# Patient Record
Sex: Female | Born: 1935 | Race: White | Hispanic: No | Smoking: Never smoker
Health system: Southern US, Community
[De-identification: ages and names within clinical notes are randomized; demographics above are authoritative.]

## PROBLEM LIST (undated history)

## (undated) DIAGNOSIS — M1710 Unilateral primary osteoarthritis, unspecified knee: Secondary | ICD-10-CM

## (undated) DIAGNOSIS — I341 Nonrheumatic mitral (valve) prolapse: Secondary | ICD-10-CM

## (undated) DIAGNOSIS — M1990 Unspecified osteoarthritis, unspecified site: Secondary | ICD-10-CM

## (undated) DIAGNOSIS — M169 Osteoarthritis of hip, unspecified: Secondary | ICD-10-CM

## (undated) DIAGNOSIS — I639 Cerebral infarction, unspecified: Secondary | ICD-10-CM

## (undated) DIAGNOSIS — D509 Iron deficiency anemia, unspecified: Secondary | ICD-10-CM

## (undated) DIAGNOSIS — R011 Cardiac murmur, unspecified: Secondary | ICD-10-CM

## (undated) HISTORY — PX: APPENDECTOMY: SHX54

## (undated) HISTORY — PX: KNEE ARTHROSCOPY: SUR90

## (undated) HISTORY — PX: BACK SURGERY: SHX140

---

## 1998-03-10 ENCOUNTER — Inpatient Hospital Stay (HOSPITAL_COMMUNITY): Admission: RE | Admit: 1998-03-10 | Discharge: 1998-03-16 | Payer: Self-pay | Admitting: Neurosurgery

## 1998-03-24 ENCOUNTER — Emergency Department (HOSPITAL_COMMUNITY): Admission: EM | Admit: 1998-03-24 | Discharge: 1998-03-24 | Payer: Self-pay | Admitting: Emergency Medicine

## 1998-08-05 ENCOUNTER — Other Ambulatory Visit: Admission: RE | Admit: 1998-08-05 | Discharge: 1998-08-05 | Payer: Self-pay | Admitting: Obstetrics and Gynecology

## 1999-02-01 ENCOUNTER — Encounter: Admission: RE | Admit: 1999-02-01 | Discharge: 1999-05-02 | Payer: Self-pay | Admitting: *Deleted

## 1999-06-15 ENCOUNTER — Ambulatory Visit (HOSPITAL_COMMUNITY): Admission: RE | Admit: 1999-06-15 | Discharge: 1999-06-15 | Payer: Self-pay | Admitting: Ophthalmology

## 1999-09-27 ENCOUNTER — Other Ambulatory Visit: Admission: RE | Admit: 1999-09-27 | Discharge: 1999-09-27 | Payer: Self-pay | Admitting: Obstetrics and Gynecology

## 1999-11-09 ENCOUNTER — Ambulatory Visit (HOSPITAL_COMMUNITY): Admission: RE | Admit: 1999-11-09 | Discharge: 1999-11-09 | Payer: Self-pay | Admitting: Gastroenterology

## 2000-01-05 ENCOUNTER — Ambulatory Visit (HOSPITAL_COMMUNITY): Admission: RE | Admit: 2000-01-05 | Discharge: 2000-01-05 | Payer: Self-pay | Admitting: *Deleted

## 2000-01-05 ENCOUNTER — Encounter: Payer: Self-pay | Admitting: Gastroenterology

## 2000-02-02 ENCOUNTER — Ambulatory Visit (HOSPITAL_COMMUNITY): Admission: RE | Admit: 2000-02-02 | Discharge: 2000-02-02 | Payer: Self-pay | Admitting: Gastroenterology

## 2000-02-02 ENCOUNTER — Encounter (INDEPENDENT_AMBULATORY_CARE_PROVIDER_SITE_OTHER): Payer: Self-pay | Admitting: Specialist

## 2000-09-27 ENCOUNTER — Other Ambulatory Visit: Admission: RE | Admit: 2000-09-27 | Discharge: 2000-09-27 | Payer: Self-pay | Admitting: Obstetrics and Gynecology

## 2001-03-30 ENCOUNTER — Emergency Department (HOSPITAL_COMMUNITY): Admission: EM | Admit: 2001-03-30 | Discharge: 2001-03-30 | Payer: Self-pay | Admitting: Emergency Medicine

## 2001-03-30 ENCOUNTER — Encounter: Payer: Self-pay | Admitting: Orthopedic Surgery

## 2001-10-04 ENCOUNTER — Other Ambulatory Visit: Admission: RE | Admit: 2001-10-04 | Discharge: 2001-10-04 | Payer: Self-pay | Admitting: Obstetrics and Gynecology

## 2002-05-28 ENCOUNTER — Encounter: Admission: RE | Admit: 2002-05-28 | Discharge: 2002-05-28 | Payer: Self-pay | Admitting: Gastroenterology

## 2002-05-28 ENCOUNTER — Encounter: Payer: Self-pay | Admitting: Gastroenterology

## 2002-06-10 ENCOUNTER — Ambulatory Visit (HOSPITAL_COMMUNITY): Admission: RE | Admit: 2002-06-10 | Discharge: 2002-06-10 | Payer: Self-pay | Admitting: Gastroenterology

## 2002-06-10 ENCOUNTER — Encounter (INDEPENDENT_AMBULATORY_CARE_PROVIDER_SITE_OTHER): Payer: Self-pay

## 2004-12-14 ENCOUNTER — Observation Stay (HOSPITAL_COMMUNITY): Admission: RE | Admit: 2004-12-14 | Discharge: 2004-12-15 | Payer: Self-pay | Admitting: *Deleted

## 2006-02-02 ENCOUNTER — Emergency Department (HOSPITAL_COMMUNITY): Admission: EM | Admit: 2006-02-02 | Discharge: 2006-02-02 | Payer: Self-pay | Admitting: Emergency Medicine

## 2008-12-12 ENCOUNTER — Encounter: Admission: RE | Admit: 2008-12-12 | Discharge: 2008-12-12 | Payer: Self-pay | Admitting: Emergency Medicine

## 2011-03-03 NOTE — Op Note (Signed)
NAMEJULIUS, BONIFACE NO.:  [id number]   MEDICAL RECORD NO.:  [id number]          PATIENT TYPE:  INP   LOCATION:  NA                           FACILITY:  [facility]   PHYSICIAN:  Vikki Ports, MDDATE OF BIRTH:  [birth year]   DATE OF PROCEDURE:  12/14/2004  DATE OF DISCHARGE:                                 OPERATIVE REPORT   PREOPERATIVE DIAGNOSIS:  Left inguinal hernia.   POSTOPERATIVE DIAGNOSIS:  Left inguinal hernia.   PROCEDURE:  Left inguinal hernia repair with mesh.   SURGEON:  Vikki Ports, M.D.   ANESTHESIA:  General.   DESCRIPTION OF PROCEDURE:  The patient was taken to the operating room and  placed in a supine position.  After adequate general anesthesia was induced  using an endotracheal tube, the abdomen was prepped and draped in the normal  sterile fashion.  Using an oblique incision over the inguinal canal, I  dissected down onto the external oblique fascia.  This was opened along its  fibers down through the external ring.  The round ligament was ligated at  the pubic tubercle.  Direct hernia contents were reduced back into the  abdominal cavity.  Transversalis fascia was approximated to the shelving  edge of Cooper's ligament and the inguinal ligament.  This was done with  interrupted #1 Surgilon sutures.  A piece of Parietex mesh was then placed  over the reinforcement and packed using a running 2-0 Prolene suture from  the pubic tubercle out laterally and up on the transversalis fascia.  Adequate hemostasis was ensured.  The external oblique fascia was closed  with a running 3-0 Vicryl.  Skin was closed with staples.  Tissues were  injected with 0.5% Marcaine.  Sterile dressing was applied.  The patient  tolerated the procedure well and went to the PACU in good condition.      KRH/MEDQ  D:  12/14/2004  T:  12/14/2004  Job:  [id number]

## 2011-03-03 NOTE — Procedures (Signed)
[facility]  Patient:    Brittany [name], Brittany [name]                      MRN: [id number] Proc. Date: 02/02/00 Adm. Date:  [id number] Attending:  Charna Elizabeth Dictator:   Anselmo Rod, M.D. CC:         Laqueta Linden, M.D.             Hubert L. Evonnie Dawes, M.D. [facility]                           Procedure Report  DATE OF BIRTH:  06/02/1924  REFERRING PHYSICIAN:  Dr. Myrlene Broker.  PROCEDURE PERFORMED:  Enteroscopy with biopsies.  ENDOSCOPIST:  Anselmo Rod, M.D.  INSTRUMENT USED:  Olympus video pan endoscope.  INDICATIONS FOR PROCEDURE:  Severe diarrhea and abdominal discomfort in a 77 year old white female, rule out peptic ulcer disease, small bowel biopsies planned to rule out malabsorption syndrome.  PRE-PROCEDURE PREPARATION:  Informed consent was secured from the patient. The patient was fasted for 8 hours prior to the procedure.  PRE-PROCEDURE PHYSICAL EXAMINATION:  VITAL SIGNS:  Stable.  NECK:  Supple.  CHEST:  Clear to auscultation.  S1 and S2 regular.  ABDOMEN:  Soft with normal abdominal bowel sounds.  Epigastric tenderness on palpation.  No guarding or rebound or hepatosplenomegaly.  No masses palpable.  DESCRIPTION OF PROCEDURE:  The patient was placed in the left lateral decubitus position and sedated with 40 mg of Demerol and 3 mg of Versed intravenously.  Once the patient was adequately sedated and maintained on low flow oxygen and continuous cardiac monitoring, the Olympus video pan endoscope was advanced through the mouth piece over the tongue into the esophagus under direct vision.  The entire esophagus appeared normal without evidence of rings, strictures, masses, lesions, or esophagitis.  The scope was then advanced into the stomach.  A small hiatal hernia was seen on high retroflexion.  Two shallow prepyloric ulcers were noticed without any evidence of a visible vessel or fresh bleeding.  There was diffuse antral  gastritis. The duodenal bulb and the small bowel up to 140 cm appeared normal.  Random small bowel biopsies were done to further work up for diarrhea.  There were no abnormalities seen in the mucosa.  The patient tolerated the procedure well without complications.  IMPRESSION: 1. Normal-appearing esophagus. 2. Small hiatal hernia. 3. Two shallow prepyloric ulcers without any visible vessel. 4. Antral gastritis. 5. Normal duodenal an proximal small bowel up to 140 cm.  RECOMMENDATIONS: 1. The patient is being started on Prilosec 20 mg one p.o. q.d., samples have    been given to her from the office for the next four weeks. 2. If she has evidence of Helicobacter pylori on pathology she will be treated    with antibiotics. 3. Await small bowel pathology. 4. Avoid all non-steroidals. 5. Follow up in the office in the next two weeks.DD:  02/02/00 TD:  02/02/00 Job: [id number]

## 2011-03-03 NOTE — Procedures (Signed)
[facility]  Patient:    Brittany [name]                       MRN: [id number] Proc. Date: 11/09/99 Adm. Date:  [id number] Attending:  Charna Elizabeth CC:         Mahala Menghini. Evonnie Dawes, M.D. [facility]             Laqueta Linden, M.D.                           Procedure Report  DATE OF BIRTH:  19-Oct-1923  REFERRING PHYSICIAN:  Mahala Menghini. Evonnie Dawes, M.D.  PROCEDURE PERFORMED:  Colonoscopy.  ENDOSCOPIST:  Anselmo Rod, M.D.  INSTRUMENT USED:  Olympus video colonoscope.  INDICATIONS:  A 77 year old white female with change in bowel habit, 25 pound weight loss and diarrhea and occasional fecal incontinence.  Rule out masses, polyps, erosions, ulcerations, etc.  PREPROCEDURE PREPARATION:  Informed consent was procured from the patient.  The  patient was fasted for 8 hours prior to the procedure and prepped with a bottle of magnesium citrate and a gallon of NuLytely the night prior to the procedure.  PREPROCEDURE PHYSICAL:  Patient has stable vital signs.  NECK:  Supple.  CHEST:  Clear to auscultation. S1, S2 regular.  ABDOMEN:  Soft with normal abdominal bowel sounds.  DESCRIPTION OF PROCEDURE:  The patient was placed in left lateral decubitus position and sedated with 50 mg of Demerol and 3 mg of Versed intravenously. Once the patient was adequately sedated and maintained on low-flow oxygen and continuous cardiac monitoring, the Olympus video colonoscope was advanced from the rectum o the cecum without difficulty, except for ______ sphincter tone, no masses, polyps, erosions, ulcerations or diverticula were seen.  The entire colonoscopy showed o abnormalities.  The patient tolerated the procedure well.  The procedure was ______ to cecum.  IMPRESSION:  Normal colonoscopy.  ______ sphincter tone.  RECOMMENDATIONS:  Patient has been advised to follow up in the office for further recommendations.  A high fiber diet has been advocated along with  liberal fluid  intake. DD:  11/10/99 TD:  11/10/99 Job: [id number]

## 2011-03-03 NOTE — Op Note (Signed)
NAME:  Brittany [name], Brittany [name]                         ACCOUNT NO.:  [id number]   MEDICAL RECORD NO.:  [id number]                   PATIENT TYPE:  AMB   LOCATION:  ENDO                                 FACILITY:  [facility]   PHYSICIAN:  Charna Elizabeth, M.D.                   DATE OF BIRTH:  [birth year]   DATE OF PROCEDURE:  06/10/2002  DATE OF DISCHARGE:                                 OPERATIVE REPORT   PROCEDURE PERFORMED:  Colonoscopy with snare polypectomy times one and  random colon biopsies times ten.   ENDOSCOPIST:  Charna Elizabeth, M.D.   INSTRUMENT COUNT:  Olympus video colonoscope (adjustable pediatric scope)   INDICATIONS FOR PROCEDURE:  Chronic diarrhea in a 77 year old white female.  Rule out collagenous colitis.   PREPROCEDURE PREPARATION:  Informed consent was procured from the patient.  The patient was fasted for eight hours prior to procedure and was prepped  with a bottle of magnesium citrate and a gallon of NuLytely the night prior  to the procedure.   PREPROCEDURE PHYSICAL:  VITAL SIGNS:  The patient had stable vital signs.  NECK:  Supple.  CHEST:  Clear to auscultation.  S1 and S2 regular.  ABDOMEN:  Soft with normal bowel sounds.   DESCRIPTION OF PROCEDURE:  The patient was placed in the left lateral  decubitus position and sedated with 50 mg of Demerol and 4 mg of Versed  intravenously.  Once the patient was adequately sedated and maintained on  low flow oxygen and continuous cardiac monitoring, the Olympus video  colonoscope was advanced from the rectum to the cecum with slight  difficulty.  The patient has a very atonic colon with relaxed sphincter  tone.  There was some scattered diverticular disease noticed throughout the  colon especially on the left side.  A small sessile polyp was removed from  the cecal base.  The terminal ileum appeared normal.  Random colon biopsies  were done times ten to rule out collagenous versus microscopic colitis.  The  patient tolerated  the procedure well without complication.   IMPRESSION:  1. Very atonic colon with relaxed sphincter tone.  2. Scattered diverticulosis.  3. Small sessile polyp snared from cecal base.  4. Random colon biopsies done to rule out collagenous colitis.   RECOMMENDATIONS:  1. Await pathology results.  2.     High fiber diet with liberal fluid intake.  3. Hold off on all nonsteroidals including aspirin for the next two weeks.  4. Outpatient follow up in the next two weeks.                                                  Charna Elizabeth, M.D.    JM/MEDQ  D:  06/10/2002  T:  06/12/2002  Job:  [id number]   cc:   Talmadge Coventry, M.D.   Laqueta Linden, M.D.  [address], [address]  [place]  [place] [address]  Fax: [phone]

## 2011-09-06 ENCOUNTER — Emergency Department (HOSPITAL_COMMUNITY): Payer: Medicare Other

## 2011-09-06 ENCOUNTER — Encounter (HOSPITAL_COMMUNITY): Payer: Self-pay | Admitting: *Deleted

## 2011-09-06 ENCOUNTER — Inpatient Hospital Stay (HOSPITAL_COMMUNITY)
Admission: EM | Admit: 2011-09-06 | Discharge: 2011-09-12 | DRG: 565 | Disposition: A | Discharge status: Skilled Nursing Facility | Payer: Medicare Other | Attending: Internal Medicine | Admitting: Internal Medicine

## 2011-09-06 DIAGNOSIS — M25462 Effusion, left knee: Secondary | ICD-10-CM

## 2011-09-06 DIAGNOSIS — M1610 Unilateral primary osteoarthritis, unspecified hip: Secondary | ICD-10-CM | POA: Diagnosis present

## 2011-09-06 DIAGNOSIS — D509 Iron deficiency anemia, unspecified: Secondary | ICD-10-CM | POA: Diagnosis present

## 2011-09-06 DIAGNOSIS — IMO0002 Reserved for concepts with insufficient information to code with codable children: Secondary | ICD-10-CM | POA: Diagnosis present

## 2011-09-06 DIAGNOSIS — M25469 Effusion, unspecified knee: Principal | ICD-10-CM | POA: Diagnosis present

## 2011-09-06 DIAGNOSIS — M169 Osteoarthritis of hip, unspecified: Secondary | ICD-10-CM | POA: Diagnosis present

## 2011-09-06 DIAGNOSIS — N390 Urinary tract infection, site not specified: Secondary | ICD-10-CM | POA: Diagnosis present

## 2011-09-06 DIAGNOSIS — D649 Anemia, unspecified: Secondary | ICD-10-CM

## 2011-09-06 DIAGNOSIS — Y92009 Unspecified place in unspecified non-institutional (private) residence as the place of occurrence of the external cause: Secondary | ICD-10-CM

## 2011-09-06 DIAGNOSIS — W010XXA Fall on same level from slipping, tripping and stumbling without subsequent striking against object, initial encounter: Secondary | ICD-10-CM | POA: Diagnosis present

## 2011-09-06 DIAGNOSIS — R52 Pain, unspecified: Secondary | ICD-10-CM

## 2011-09-06 DIAGNOSIS — M1710 Unilateral primary osteoarthritis, unspecified knee: Secondary | ICD-10-CM | POA: Diagnosis present

## 2011-09-06 DIAGNOSIS — B961 Klebsiella pneumoniae [K. pneumoniae] as the cause of diseases classified elsewhere: Secondary | ICD-10-CM | POA: Diagnosis present

## 2011-09-06 DIAGNOSIS — F0390 Unspecified dementia without behavioral disturbance: Secondary | ICD-10-CM | POA: Diagnosis present

## 2011-09-06 DIAGNOSIS — W19XXXA Unspecified fall, initial encounter: Secondary | ICD-10-CM

## 2011-09-06 DIAGNOSIS — E876 Hypokalemia: Secondary | ICD-10-CM | POA: Diagnosis present

## 2011-09-06 DIAGNOSIS — M25459 Effusion, unspecified hip: Secondary | ICD-10-CM | POA: Diagnosis present

## 2011-09-06 HISTORY — DX: Unspecified osteoarthritis, unspecified site: M19.90

## 2011-09-06 HISTORY — DX: Cardiac murmur, unspecified: R01.1

## 2011-09-06 HISTORY — DX: Nonrheumatic mitral (valve) prolapse: I34.1

## 2011-09-06 HISTORY — DX: Cerebral infarction, unspecified: I63.9

## 2011-09-06 LAB — BASIC METABOLIC PANEL
CO2: 25 mEq/L (ref 19–32)
Calcium: 9.4 mg/dL (ref 8.4–10.5)
Chloride: 99 mEq/L (ref 96–112)
Glucose, Bld: 103 mg/dL — ABNORMAL HIGH (ref 70–99)
Potassium: 3.2 mEq/L — ABNORMAL LOW (ref 3.5–5.1)
Sodium: 135 mEq/L (ref 135–145)

## 2011-09-06 LAB — URINALYSIS, ROUTINE W REFLEX MICROSCOPIC
Protein, ur: NEGATIVE mg/dL
Specific Gravity, Urine: 1.02 (ref 1.005–1.030)
Urobilinogen, UA: 0.2 mg/dL (ref 0.0–1.0)

## 2011-09-06 LAB — DIFFERENTIAL
Basophils Absolute: 0 10*3/uL (ref 0.0–0.1)
Basophils Relative: 0 % (ref 0–1)
Eosinophils Absolute: 0.1 10*3/uL (ref 0.0–0.7)
Eosinophils Relative: 1 % (ref 0–5)
Lymphocytes Relative: 17 % (ref 12–46)
Monocytes Absolute: 1.1 10*3/uL — ABNORMAL HIGH (ref 0.1–1.0)
Neutro Abs: 4.6 10*3/uL (ref 1.7–7.7)

## 2011-09-06 LAB — URINE MICROSCOPIC-ADD ON

## 2011-09-06 LAB — CBC
MCV: 96.9 fL (ref 78.0–100.0)
Platelets: 241 10*3/uL (ref 150–400)
RBC: 3.52 MIL/uL — ABNORMAL LOW (ref 3.87–5.11)

## 2011-09-06 MED ORDER — PANTOPRAZOLE SODIUM 40 MG PO TBEC
40.0000 mg | DELAYED_RELEASE_TABLET | Freq: Every day | ORAL | Status: DC
  Administered 2011-09-06 – 2011-09-12 (×7): 40 mg via ORAL
  Filled 2011-09-06 (×7): qty 1

## 2011-09-06 MED ORDER — INFLUENZA VIRUS VACC SPLIT PF IM SUSP
0.5000 mL | INTRAMUSCULAR | Status: AC
  Administered 2011-09-07: 0.5 mL via INTRAMUSCULAR
  Filled 2011-09-06: qty 0.5

## 2011-09-06 MED ORDER — DEXTROSE 5 % IV SOLN
1.0000 g | Freq: Once | INTRAVENOUS | Status: AC
  Administered 2011-09-06: 1 g via INTRAVENOUS
  Filled 2011-09-06: qty 10

## 2011-09-06 MED ORDER — LORAZEPAM 2 MG/ML IJ SOLN
1.0000 mg | Freq: Once | INTRAMUSCULAR | Status: AC
  Administered 2011-09-06: 1 mg via INTRAVENOUS
  Filled 2011-09-06: qty 1

## 2011-09-06 MED ORDER — ENSURE CLINICAL ST REVIGOR PO LIQD
237.0000 mL | Freq: Three times a day (TID) | ORAL | Status: DC
  Administered 2011-09-07 – 2011-09-09 (×7): 237 mL via ORAL
  Administered 2011-09-10 (×2): via ORAL
  Administered 2011-09-11: 237 mL via ORAL
  Filled 2011-09-06 (×6): qty 237

## 2011-09-06 MED ORDER — POTASSIUM CHLORIDE 20 MEQ/15ML (10%) PO LIQD
ORAL | Status: AC
  Administered 2011-09-06: 40 meq via ORAL
  Filled 2011-09-06: qty 30

## 2011-09-06 MED ORDER — ACETAMINOPHEN 500 MG PO TABS
500.0000 mg | ORAL_TABLET | Freq: Four times a day (QID) | ORAL | Status: DC | PRN
  Administered 2011-09-07 – 2011-09-10 (×4): 500 mg via ORAL
  Filled 2011-09-06 (×5): qty 1

## 2011-09-06 MED ORDER — POTASSIUM CHLORIDE CRYS ER 20 MEQ PO TBCR
20.0000 meq | EXTENDED_RELEASE_TABLET | Freq: Two times a day (BID) | ORAL | Status: DC
  Administered 2011-09-06: 20 meq via ORAL
  Filled 2011-09-06 (×3): qty 1

## 2011-09-06 MED ORDER — POTASSIUM CHLORIDE 20 MEQ/15ML (10%) PO LIQD
40.0000 meq | Freq: Once | ORAL | Status: AC
  Administered 2011-09-06: 40 meq via ORAL

## 2011-09-06 MED ORDER — SODIUM CHLORIDE 0.9 % IV SOLN
INTRAVENOUS | Status: DC
  Administered 2011-09-06 – 2011-09-07 (×3): via INTRAVENOUS

## 2011-09-06 MED ORDER — IBUPROFEN 800 MG PO TABS
400.0000 mg | ORAL_TABLET | Freq: Four times a day (QID) | ORAL | Status: DC | PRN
  Administered 2011-09-07: 400 mg via ORAL
  Filled 2011-09-06: qty 2

## 2011-09-06 MED ORDER — POTASSIUM CHLORIDE CRYS ER 20 MEQ PO TBCR
40.0000 meq | EXTENDED_RELEASE_TABLET | Freq: Once | ORAL | Status: DC
  Filled 2011-09-06: qty 1

## 2011-09-06 NOTE — ED Notes (Signed)
Report given to Doneen Poisson, RN.

## 2011-09-06 NOTE — H&P (Deleted)
Error

## 2011-09-06 NOTE — ED Notes (Signed)
Daughter reports that mother fell at home on Monday off stool-patient has dementia and it appears that she is unable to sleep due to right hip pain, patient non-verbal on assessment

## 2011-09-06 NOTE — ED Notes (Signed)
Patient transported to X-ray 

## 2011-09-06 NOTE — ED Provider Notes (Signed)
History     CSN: [id number] Arrival date & time: 09/06/2011 10:33 AM   First MD Initiated Contact with Patient 09/06/11 1240      No chief complaint on file.  Chief complaint fall (Consider location/radiation/quality/duration/timing/severity/associated sxs/prior treatment) HPI Level V caveat history of dementia. History given by daughter who accompanies her. Patient fell while walking with her walker 2 days ago while at home. Initially complained of left hip pain. Complains of no pain yesterday and was ambulatory without difficulty. Today complains of pain "all over". Patient was ambulatory until yesterday. Daughter states she is unable to be cared for at home. And unable to care for herself. Presently has home health with her 3 hours per day No past medical history on file. Demented No past surgical history on file.  No family history on file.  History  Substance Use Topics  . Smoking status: Not on file  . Smokeless tobacco: Not on file  . Alcohol Use: Not on file    OB History    No data available      Review of Systems  Unable to perform ROS   Allergies  Penicillins  Home Medications   Current Outpatient Rx  Name Route Sig Dispense Refill  . ACETAMINOPHEN 500 MG PO TABS Oral Take 500 mg by mouth every 6 (six) hours as needed. pain     . ENSURE PLUS PO LIQD Oral Take 237 mLs by mouth 3 (three) times daily between meals.        BP 115/49  Pulse 85  Temp(Src) 99.4 F (37.4 C) (Oral)  Resp 16  SpO2 96%  Physical Exam  Constitutional:       Chronically ill appearing, cachectic uncooperative with exam  HENT:  Head: Normocephalic and atraumatic.  Eyes: EOM are normal.  Neck: Normal range of motion.       Non-tender  Cardiovascular: Regular rhythm.   Pulmonary/Chest: Breath sounds normal. She exhibits no tenderness.  Abdominal: Soft. There is no tenderness.  Musculoskeletal:       Left lower extremity tender at me, with mild swelling, all other  extremities nontender neurovascularly intact Pelvis stable nontender. There is a baseball sized ecchymotic area to left buttock,. No pain on internal rotation of either thigh. No deformity at hip. No external rotation or shortening of either lower terminates. All 4 extremities neurovascularly  Neurological:       Combative, moves all extremities motor strength 5 over 5 overall    ED Course  Procedures (including critical care time)  Labs Reviewed - No data to display No results found.   No diagnosis found.  4:50 PM patient more calm cooperative after treatment with Ativan intravenously.  MDM  Spoke With Doctor Irene Limbo plan admit  Iv antibiotics suggest social service consult to discuss with family alternatives for home situation I home health care versus skilled nursing facility Diagnosis #1 urinary tract infection #2 fall 3 multiple contusions #4hypokalemia  Doug Sou, MD 09/06/11 1709

## 2011-09-06 NOTE — ED Notes (Signed)
Nurse unavailable will call back

## 2011-09-06 NOTE — ED Notes (Signed)
Secondary Assessment: pt from home. On Monday daughter found patient on floor on her back, with step stool flipped over and closet door open. Daughter suspect that patient might have fell from her step stool. Pt denied any pain on Monday, was alert, functional with no abnormality noted. Last night pt started to c/o right hip pain. Daughter consulted PCP and was advised to come to ED to r/o fracture. In our ED, pt c/o right shoulder pain, and denied any hip pain. No loss of skin integrity noted. Pt has demential.

## 2011-09-06 NOTE — ED Notes (Signed)
Family at bedside. 

## 2011-09-06 NOTE — H&P (Signed)
PCP:  No primary provider on file. Chief Complaint:  Aches and pains all over the body x 4 days' duration after a fall  HPI:  Patient is an 77 year old Caucasian female with history of dementia, prior CVA with no neuro deficits and ambulate with the help of a walker was said to have fallen at home. Post fall patient complained of aches and pain over the bony the body especially the knees. She denied any premonitosy symptoms prior to the onset of fall. She denied any chest pain or  shortness of breath,. She also denied any systemic symptoms i.e. no fever chills no rigors. Aches and pain and knee pain was said to be getting  progressively worse and subsequently she was brought to the emergency room to be evaluated.  Review of Systems:  The patient denies anorexia, fever, weight loss,, vision loss, decreased hearing, hoarseness, chest pain, syncope, dyspnea on exertion, peripheral edema, balance deficits, hemoptysis, abdominal pain, melena, hematochezia, severe indigestion/heartburn, hematuria, incontinence, genital sores, muscle weakness, suspicious skin lesions, transient blindness, difficulty walking+, depression, unusual weight change, abnormal bleeding, enlarged lymph nodes, angioedema, and breast masses.  Past Medical History:  Past Medical History  Diagnosis Date  . Murmur, cardiac   . Mitral valve prolapse   . Arthritis   . DEMENTIA   . Stroke     hx 2 mini strokes    Past Surgical History  Procedure Date  . Knee arthroscopy     which knee unknown  . Back surgery   . Appendectomy     years ago    Medications:  Prior to Admission medications   Medication Sig Start Date End Date Taking? Authorizing Provider  acetaminophen (TYLENOL) 500 MG tablet Take 500 mg by mouth every 6 (six) hours as needed. pain    Yes Historical Provider, MD  Ensure Plus (ENSURE PLUS) LIQD Take 237 mLs by mouth 3 (three) times daily between meals.     Yes Historical Provider, MD     Allergies:  Allergies  Allergen Reactions  . Penicillins Hives    Social History:   reports that she has never smoked. She has never used smokeless tobacco. She reports that she does not drink alcohol or use illicit drugs.  Family History:  History reviewed. No pertinent family history.  Physical Exam:  Filed Vitals:   09/06/11 1037 09/06/11 1224 09/06/11 1258 09/06/11 1657  BP: 117/68 115/49  130/60  Pulse: 116 85  95  Temp: 99.4 F (37.4 C) 99.4 F (37.4 C) 99.5 F (37.5 C) 98.5 F (36.9 C)  TempSrc:  Oral Rectal Oral  Resp: 16 16    SpO2: 94% 96%  95%      General: Alert and oriented times three, well developed and nourished, no acute distress  Eyes: PERRLA, pink conjunctiva, scleral icterus  ENT: Moist oral mucosa, neck supple, no thyromegaly  Lungs: clear to ascultation, no wheeze, no crackles, no use of accessory muscles  Cardiovascular: regular rate and rhythm, no regurgitation, no gallops, no murmurs. No carotid bruits, no JVD  Abdomen: soft, positive BS, non-tender, non-distended, no organomegaly, not an acute abdomen  GU: not examined  Neuro: CN II - XII grossly intact, sensation intact  Musculoskeletal: strength 5/5 all extremities, no clubbing, cyanosis or edema,deformity in the knees and arthritic changes in the feet  Skin: no rash, no subcutaneous crepitation, no decubitus  Psych: appropriate patient  ?  Labs on Admission:   [facility] 09/06/11 1413  NA 135  K 3.2*  CL 99  CO2 25  GLUCOSE 103*  BUN 39*  CREATININE 0.82  CALCIUM 9.4  MG --  PHOS --    No results found for this basename: AST:2,ALT:2,ALKPHOS:2,BILITOT:2,PROT:2,ALBUMIN:2 in the last 72 hours  No results found for this basename: LIPASE:2,AMYLASE:2 in the last 72 hours   Basename 09/06/11 1413  WBC 7.0  NEUTROABS 4.6  HGB 11.2*  HCT 34.1*  MCV 96.9  PLT 241    No results found for this basename: CKTOTAL:3,CKMB:3,CKMBINDEX:3,TROPONINI:3 in the last 72  hours  No results found for this basename: TSH,T4TOTAL,FREET3,T3FREE,THYROIDAB in the last 72 hours  No results found for this basename: VITAMINB12:2,FOLATE:2,FERRITIN:2,TIBC:2,IRON:2,RETICCTPCT:2 in the last 72 hours  Radiological Exams on Admission:  Dg Chest 1 View  09/06/2011  *RADIOLOGY REPORT*  Clinical Data: Fall and altered mental status.  CHEST - 1 VIEW  Comparison: 02/03/2011  Findings: Limited evaluation of the left upper chest due to the patient's overlying jaw.  The visualized lungs are clear.  Heart and mediastinum are within normal limits.  There is marked scoliosis of the thoracolumbar spine. Deformity of the proximal left humerus consistent with an old fracture.  There is a fracture involving the right sixth rib of unknown age.  IMPRESSION: Limited evaluation of the left upper lung as described. Otherwise, no evidence for acute chest disease.  Right rib fracture of unknown age.  Original Report Authenticated By: Richarda Overlie, M.D.   Dg Pelvis 1-2 Views  09/06/2011  *RADIOLOGY REPORT*  Clinical Data: Pelvic pain, fall  PELVIS - 1-2 VIEW  Comparison: None  Findings: Severe osseous demineralization limits exam. Mild narrowing of hip joints bilaterally. Rotated to the right. SI joints preserved. No definite acute fracture, dislocation or bone destruction within limitations as above. Scattered pelvic phleboliths. Prior L4-L5 Ray cage fusion. Scattered atherosclerotic calcifications.  IMPRESSION: Osseous demineralization. No definite acute bony abnormalities identified within limitations of exam as above.  Original Report Authenticated By: Lollie Marrow, M.D.   Ct Pelvis Wo Contrast  09/06/2011  *RADIOLOGY REPORT*  Clinical Data:  Pelvic and right hip pain.  CT PELVIS WITHOUT CONTRAST  Technique:  Multidetector CT imaging of the pelvis was performed following the standard protocol without intravenous contrast.  Comparison:  12/12/2008  Findings:  Both hips are located.  No evidence for  acute fracture involving the hips or proximal femurs.  Pelvic bony structures are intact.  Small amount of gas within the urinary bladder may be iatrogenic.   No gross abnormality to the uterus or adnexal structures.  No evidence for free fluid or pelvic lymphadenopathy. A ventral hernia containing fat.  There is interbody fusion at L4- L5 with stable anterolisthesis at L4-L5.  IMPRESSION: No acute bony abnormality to the pelvis or hips.  Stable appearance of the lower lumbar spine with anterolisthesis and interbody fusion.  Original Report Authenticated By: Richarda Overlie, M.D.   Ct Knee Left Wo Contrast  09/06/2011  *RADIOLOGY REPORT*  Clinical Data: Left knee pain.  Injury.  CT OF THE LEFT KNEE WITHOUT CONTRAST  Technique:  Multidetector CT imaging was performed according to the standard protocol. Multiplanar CT image reconstructions were also generated.  Comparison: 09/06/2011 radiographs  Findings: Moderate knee effusion noted with markedly severe tri- compartmental spurring.  Faint calcification along some of the synovial margins noted, along with faint calcification along articular cartilage.  Particularly in the lateral compartment, there is severe loss of articular space with subcortical sclerosis and cortical irregularity which is a chronic appearance.  The medial compartment appears  somewhat widened, with prominent osteophytes along the articular surface of the medial femoral condyle and not simply confined to the margins.  A large osteophyte from the inferior pole of the patella extends posteriorly between the tibia and the femur as shown on image 49 of series 1002.  I favor that the faint calcification along the medial tibial plateau is due to chondrocalcinosis rather than periosteal reaction.  A well-defined fracture plane is not identified. MRI could offer increased sensitivity for subtle trabecular microfracture and marrow edema.  A small free osteochondral fragment is present between the marginal  osteophytes in the lateral compartment on image 49 of series 1003.  A moderate to large Baker's cyst is present.  Fluid is noted in the popliteal recess.  IMPRESSION:  1.  Marked tri-compartmental spurring with suspected chondrocalcinosis and some faint calcification along the synovial lining in the knee joint. CPPD arthropathy is suspected as a potential etiology. 2.  Knee joint effusion with moderate to large Baker's cyst. 3.  A discrete fracture is not identified, although given the degree of marked spurring and cortical irregularity, the possibility of a subtle impaction cannot be completely discounted. MRI can offer increased sensitivity for marrow edema and trabecular microfracture, if clinically warranted. 4.  A large patellar spur extends posteriorly and between the tibia and femur.  This could impinge during extension.  Original Report Authenticated By: Dellia Cloud, M.D.   Dg Knee Complete 4 Views Left  09/06/2011  *RADIOLOGY REPORT*  Clinical Data: Knee pain, altered mental status  LEFT KNEE - COMPLETE 4+ VIEW  Comparison: None  Findings: Patient uncooperative for imaging. Extensive atherosclerotic calcification. Tricompartmental osteoarthritic changes with joint space narrowing and spur formation. No acute fracture, dislocation or bone destruction. Question knee joint effusion.  IMPRESSION: Osseous demineralization with advanced tricompartmental osteoarthritic changes and suspected small knee joint effusion. No definite acute bony abnormalities. Extensive atherosclerotic calcification.  Original Report Authenticated By: Lollie Marrow, M.D.    Assessment/Plan Problems: #1 aches and pain #2 fall #3 bilateral knee pain #4 arthritis #5 dementia   Impression:  #1 crystal arthropathy #2 Baker's cyst #3 joint effusion #4 hypokalemia #5 dementia #6 history of CVA-no neuro deficit  Plan: #1 ibuprofen  #2 arthrocentesis and a.m #3 replace potassium #4 consult physical  therapy.

## 2011-09-06 NOTE — ED Notes (Signed)
Pt given meal. Pt caregiver feeding meal to pt

## 2011-09-06 NOTE — ED Notes (Signed)
Patient transported to CT 

## 2011-09-06 NOTE — ED Notes (Signed)
Dr. Ethelda Chick at the bedside assessing pt.

## 2011-09-07 DIAGNOSIS — E876 Hypokalemia: Secondary | ICD-10-CM

## 2011-09-07 DIAGNOSIS — M25462 Effusion, left knee: Secondary | ICD-10-CM

## 2011-09-07 DIAGNOSIS — D649 Anemia, unspecified: Secondary | ICD-10-CM

## 2011-09-07 DIAGNOSIS — F0390 Unspecified dementia without behavioral disturbance: Secondary | ICD-10-CM

## 2011-09-07 DIAGNOSIS — R52 Pain, unspecified: Secondary | ICD-10-CM

## 2011-09-07 LAB — COMPREHENSIVE METABOLIC PANEL
CO2: 25 mEq/L (ref 19–32)
Calcium: 8.8 mg/dL (ref 8.4–10.5)
Creatinine, Ser: 0.67 mg/dL (ref 0.50–1.10)
GFR calc Af Amer: >90 mL/min (ref >90)
GFR calc non Af Amer: 78 mL/min — ABNORMAL LOW (ref >90)
Glucose, Bld: 101 mg/dL — ABNORMAL HIGH (ref 70–99)
Total Protein: 5.3 g/dL — ABNORMAL LOW (ref 6.0–8.3)

## 2011-09-07 LAB — CBC
HCT: 30.8 % — ABNORMAL LOW (ref 36.0–46.0)
MCH: 32.8 pg (ref 26.0–34.0)
MCV: 99 fL (ref 78.0–100.0)
Platelets: 218 10*3/uL (ref 150–400)
RDW: 13.5 % (ref 11.5–15.5)
WBC: 5.7 10*3/uL (ref 4.0–10.5)

## 2011-09-07 LAB — DIFFERENTIAL
Basophils Absolute: 0 10*3/uL (ref 0.0–0.1)
Eosinophils Absolute: 0.1 10*3/uL (ref 0.0–0.7)
Eosinophils Relative: 2 % (ref 0–5)
Lymphocytes Relative: 20 % (ref 12–46)
Lymphs Abs: 1.1 10*3/uL (ref 0.7–4.0)
Monocytes Absolute: 0.8 10*3/uL (ref 0.1–1.0)

## 2011-09-07 LAB — VITAMIN B12: Vitamin B-12: 246 pg/mL (ref 211–911)

## 2011-09-07 LAB — FOLATE: Folate: 16.4 ng/mL

## 2011-09-07 LAB — IRON AND TIBC
Iron: 35 ug/dL — ABNORMAL LOW (ref 42–135)
TIBC: 209 ug/dL — ABNORMAL LOW (ref 250–470)

## 2011-09-07 LAB — RETICULOCYTES: Retic Count, Absolute: 44.1 10*3/uL (ref 19.0–186.0)

## 2011-09-07 MED ORDER — IBUPROFEN 200 MG PO TABS: 200.0000 mg | ORAL_TABLET | Freq: Three times a day (TID) | ORAL | Status: DC

## 2011-09-07 MED ORDER — CIPROFLOXACIN IN D5W 400 MG/200ML IV SOLN
400.0000 mg | Freq: Two times a day (BID) | INTRAVENOUS | Status: DC
  Administered 2011-09-07 – 2011-09-08 (×2): 400 mg via INTRAVENOUS
  Filled 2011-09-07 (×7): qty 200

## 2011-09-07 NOTE — Progress Notes (Addendum)
Subjective: Awake, complaining of left knee pain, some right right hip pain.  Objective: Filed Vitals:   09/06/11 1657 09/06/11 2006 09/06/11 2239 09/07/11 0440  BP: 130/60 130/50 111/66 120/59  Pulse: 95 106 89 83  Temp: 98.5 F (36.9 C) 98.4 F (36.9 C) 98.5 F (36.9 C) 98.8 F (37.1 C)  TempSrc: Oral Oral Oral Oral  Resp:  19 18 20   Weight:   54.477 kg (120 lb 1.6 oz)   SpO2: 95% 99% 94% 95%   Weight change:  No intake or output data in the 24 hours ending 09/07/11 0816  General: Alert, awake,  in no acute distress.  HEENT: No bruits, no goiter.  Heart: Regular rate and rhythm, without murmurs, rubs, gallops.  Lungs: Crackles left side, bilateral air movement.  Abdomen: Soft, nontender, nondistended, positive bowel sounds.  Neuro: Grossly intact, nonfocal. Extremities:  left knee swelling.    Lab Results:  [facility] 09/07/11 0330 09/06/11 1413  NA 138 135  K 4.2 3.2*  CL 106 99  CO2 25 25  GLUCOSE 101* 103*  BUN 29* 39*  CREATININE 0.67 0.82  CALCIUM 8.8 9.4  MG 2.4 --  PHOS -- --    Basename 09/07/11 0330  AST 52*  ALT 38*  ALKPHOS 60  BILITOT 0.4  PROT 5.3*  ALBUMIN 2.6*    Basename 09/07/11 0330 09/06/11 1413  WBC 5.7 7.0  NEUTROABS 3.6 4.6  HGB 10.2* 11.2*  HCT 30.8* 34.1*  MCV 99.0 96.9  PLT 218 241    Studies/Results: Dg Chest 1 View  09/06/2011  *RADIOLOGY REPORT*  Clinical Data: Fall and altered mental status.  CHEST - 1 VIEW  Comparison: 02/03/2011  Findings: Limited evaluation of the left upper chest due to the patient's overlying jaw.  The visualized lungs are clear.  Heart and mediastinum are within normal limits.  There is marked scoliosis of the thoracolumbar spine. Deformity of the proximal left humerus consistent with an old fracture.  There is a fracture involving the right sixth rib of unknown age.  IMPRESSION: Limited evaluation of the left upper lung as described. Otherwise, no evidence for acute chest disease.  Right rib fracture  of unknown age.  Original Report Authenticated By: Richarda Overlie, M.D.   Dg Pelvis 1-2 Views  09/06/2011  *RADIOLOGY REPORT*  Clinical Data: Pelvic pain, fall  PELVIS - 1-2 VIEW  Comparison: None  Findings: Severe osseous demineralization limits exam. Mild narrowing of hip joints bilaterally. Rotated to the right. SI joints preserved. No definite acute fracture, dislocation or bone destruction within limitations as above. Scattered pelvic phleboliths. Prior L4-L5 Ray cage fusion. Scattered atherosclerotic calcifications.  IMPRESSION: Osseous demineralization. No definite acute bony abnormalities identified within limitations of exam as above.  Original Report Authenticated By: Lollie Marrow, M.D.   Ct Pelvis Wo Contrast  09/06/2011  *RADIOLOGY REPORT*  Clinical Data:  Pelvic and right hip pain.  CT PELVIS WITHOUT CONTRAST  Technique:  Multidetector CT imaging of the pelvis was performed following the standard protocol without intravenous contrast.  Comparison:  12/12/2008  Findings:  Both hips are located.  No evidence for acute fracture involving the hips or proximal femurs.  Pelvic bony structures are intact.  Small amount of gas within the urinary bladder may be iatrogenic.   No gross abnormality to the uterus or adnexal structures.  No evidence for free fluid or pelvic lymphadenopathy. A ventral hernia containing fat.  There is interbody fusion at L4- L5 with stable anterolisthesis at L4-L5.  IMPRESSION: No acute bony abnormality to the pelvis or hips.  Stable appearance of the lower lumbar spine with anterolisthesis and interbody fusion.  Original Report Authenticated By: Richarda Overlie, M.D.   Ct Knee Left Wo Contrast  09/06/2011  *RADIOLOGY REPORT*  Clinical Data: Left knee pain.  Injury.  CT OF THE LEFT KNEE WITHOUT CONTRAST  Technique:  Multidetector CT imaging was performed according to the standard protocol. Multiplanar CT image reconstructions were also generated.  Comparison: 09/06/2011 radiographs   Findings: Moderate knee effusion noted with markedly severe tri- compartmental spurring.  Faint calcification along some of the synovial margins noted, along with faint calcification along articular cartilage.  Particularly in the lateral compartment, there is severe loss of articular space with subcortical sclerosis and cortical irregularity which is a chronic appearance.  The medial compartment appears somewhat widened, with prominent osteophytes along the articular surface of the medial femoral condyle and not simply confined to the margins.  A large osteophyte from the inferior pole of the patella extends posteriorly between the tibia and the femur as shown on image 49 of series 1002.  I favor that the faint calcification along the medial tibial plateau is due to chondrocalcinosis rather than periosteal reaction.  A well-defined fracture plane is not identified. MRI could offer increased sensitivity for subtle trabecular microfracture and marrow edema.  A small free osteochondral fragment is present between the marginal osteophytes in the lateral compartment on image 49 of series 1003.  A moderate to large Baker's cyst is present.  Fluid is noted in the popliteal recess.  IMPRESSION:  1.  Marked tri-compartmental spurring with suspected chondrocalcinosis and some faint calcification along the synovial lining in the knee joint. CPPD arthropathy is suspected as a potential etiology. 2.  Knee joint effusion with moderate to large Baker's cyst. 3.  A discrete fracture is not identified, although given the degree of marked spurring and cortical irregularity, the possibility of a subtle impaction cannot be completely discounted. MRI can offer increased sensitivity for marrow edema and trabecular microfracture, if clinically warranted. 4.  A large patellar spur extends posteriorly and between the tibia and femur.  This could impinge during extension.  Original Report Authenticated By: Dellia Cloud, M.D.   Dg  Knee Complete 4 Views Left  09/06/2011  *RADIOLOGY REPORT*  Clinical Data: Knee pain, altered mental status  LEFT KNEE - COMPLETE 4+ VIEW  Comparison: None  Findings: Patient uncooperative for imaging. Extensive atherosclerotic calcification. Tricompartmental osteoarthritic changes with joint space narrowing and spur formation. No acute fracture, dislocation or bone destruction. Question knee joint effusion.  IMPRESSION: Osseous demineralization with advanced tricompartmental osteoarthritic changes and suspected small knee joint effusion. No definite acute bony abnormalities. Extensive atherosclerotic calcification.  Original Report Authenticated By: Lollie Marrow, M.D.    Medications: I have reviewed the patient's current medications.  1-Left knee pain and effusion:  I will get MRI rule out Microfracture. Continue  Tylenol, I will discontinue ibuprofen prior history ulcer and gastritis.  Arthrocentesis depending on MRI results.   2-Hypokalemia: Repleted.   3.-Dementia: Stable.   4-Fall: PT , OT evaluation.   5.Right hip pain: I will get MRI right hip, due to severe pain after fall.   6-Pyuria:  I will start ciprofloxacin. Urine culture pending.   7-Anemia: I will check anemia panel.  8.DVT prophylaxis: SCD, monitor HB.      LOS: 1 day   Evvie Behrmann M.D.  [facility] 09/07/2011, 8:16 AM

## 2011-09-08 ENCOUNTER — Inpatient Hospital Stay (HOSPITAL_COMMUNITY): Payer: Medicare Other

## 2011-09-08 ENCOUNTER — Other Ambulatory Visit: Payer: Self-pay

## 2011-09-08 LAB — URINE CULTURE
Colony Count: >=100000
Culture  Setup Time: 201211220130

## 2011-09-08 MED ORDER — VITAMIN B-12 100 MCG PO TABS
100.0000 ug | ORAL_TABLET | Freq: Every day | ORAL | Status: DC
  Administered 2011-09-09 – 2011-09-12 (×4): 100 ug via ORAL
  Filled 2011-09-08 (×5): qty 1

## 2011-09-08 MED ORDER — HALOPERIDOL LACTATE 5 MG/ML IJ SOLN: 1.0000 mg | Freq: Two times a day (BID) | INTRAMUSCULAR | Status: DC | PRN

## 2011-09-08 MED ORDER — HALOPERIDOL 1 MG PO TABS
1.0000 mg | ORAL_TABLET | Freq: Three times a day (TID) | ORAL | Status: DC | PRN
  Filled 2011-09-08: qty 1

## 2011-09-08 MED ORDER — LORAZEPAM 2 MG/ML PO CONC: 0.5000 mg | Freq: Four times a day (QID) | ORAL | Status: DC | PRN

## 2011-09-08 MED ORDER — LORAZEPAM 0.5 MG PO TABS: 0.5000 mg | ORAL_TABLET | Freq: Four times a day (QID) | ORAL | Status: DC | PRN

## 2011-09-08 MED ORDER — FERROUS SULFATE 325 (65 FE) MG PO TABS
325.0000 mg | ORAL_TABLET | Freq: Two times a day (BID) | ORAL | Status: DC
  Administered 2011-09-08 – 2011-09-12 (×7): 325 mg via ORAL
  Filled 2011-09-08 (×10): qty 1

## 2011-09-08 NOTE — Progress Notes (Signed)
PT Cancellation Note  Evaluation cancelled today due to medical issues with patient which prohibited therapy (awaiting MRI of left knee, right hip.)  Also will need MD to write OOB orders, as current activity order strict bedrest.  Thanks!     Signature: Cyndi R. Sharee Pimple, P.T. Pager:[phone] 09/08/2011

## 2011-09-08 NOTE — Progress Notes (Signed)
Pt removed IV this evening on 09/07/11 and refused another IV. Unable to administer IV ABX.  Eugene Garnet [facility] 09/08/2011 2:02 AM

## 2011-09-08 NOTE — Progress Notes (Signed)
Subjective: Sitting in bed with caregiver at bedside. Patient is calm, pleasantly confused. Right hip pain better. Left knee pain better.  Patient was agitated earlier today.  Objective: Filed Vitals:   09/07/11 0440 09/07/11 1316 09/07/11 2048 09/08/11 0610  BP: 120/59 113/66  165/65  Pulse: 83 85 81 81  Temp: 98.8 F (37.1 C) 98.9 F (37.2 C) 98.3 F (36.8 C) 98.4 F (36.9 C)  TempSrc: Oral Oral Oral Oral  Resp: 20 18 15 16   Weight:      SpO2: 95% 95% 96% 95%   Weight change:   Intake/Output Summary (Last 24 hours) at 09/08/11 1032 Last data filed at 09/08/11 0900  Gross per 24 hour  Intake   1350 ml  Output    325 ml  Net   1025 ml    General: Alert, awake, in no acute distress.  HEENT: No bruits, no goiter.  Heart: Regular rate and rhythm, without murmurs, rubs, gallops.  Lungs: , bilateral air movement, no wheezes or ronchy.  Abdomen: Soft, nontender, nondistended, positive bowel sounds.  Neuro: Grossly intact, nonfocal. Left knee with swelling, no redness.    Lab Results:  [facility] 09/07/11 0330 09/06/11 1413  NA 138 135  K 4.2 3.2*  CL 106 99  CO2 25 25  GLUCOSE 101* 103*  BUN 29* 39*  CREATININE 0.67 0.82  CALCIUM 8.8 9.4  MG 2.4 --  PHOS -- --    Basename 09/07/11 0330  AST 52*  ALT 38*  ALKPHOS 60  BILITOT 0.4  PROT 5.3*  ALBUMIN 2.6*   Basename 09/07/11 0330 09/06/11 1413  WBC 5.7 7.0  NEUTROABS 3.6 4.6  HGB 10.2* 11.2*  HCT 30.8* 34.1*  MCV 99.0 96.9  PLT 218 241   Basename 09/07/11 0352  VITAMINB12 246  FOLATE 16.4  FERRITIN 203  TIBC 209*  IRON 35*  RETICCTPCT 1.4    Micro Results: Recent Results (from the past 240 hour(s))  URINE CULTURE     Status: Normal (Preliminary result)   Collection Time   09/06/11  1:19 PM      Component Value Range Status Comment   Specimen Description URINE, CATHETERIZED   Final    Special Requests NONE   Final    Setup Time [id number]   Final    Colony Count >=100,000 COLONIES/ML   Final     Culture GRAM NEGATIVE RODS   Final    Report Status PENDING   Incomplete     Studies/Results: Dg Chest 1 View  09/06/2011  *RADIOLOGY REPORT*  Clinical Data: Fall and altered mental status.  CHEST - 1 VIEW  Comparison: 02/03/2011  Findings: Limited evaluation of the left upper chest due to the patient's overlying jaw.  The visualized lungs are clear.  Heart and mediastinum are within normal limits.  There is marked scoliosis of the thoracolumbar spine. Deformity of the proximal left humerus consistent with an old fracture.  There is a fracture involving the right sixth rib of unknown age.  IMPRESSION: Limited evaluation of the left upper lung as described. Otherwise, no evidence for acute chest disease.  Right rib fracture of unknown age.  Original Report Authenticated By: Richarda Overlie, M.D.   Dg Pelvis 1-2 Views  09/06/2011  *RADIOLOGY REPORT*  Clinical Data: Pelvic pain, fall  PELVIS - 1-2 VIEW  Comparison: None  Findings: Severe osseous demineralization limits exam. Mild narrowing of hip joints bilaterally. Rotated to the right. SI joints preserved. No definite acute fracture, dislocation or bone  destruction within limitations as above. Scattered pelvic phleboliths. Prior L4-L5 Ray cage fusion. Scattered atherosclerotic calcifications.  IMPRESSION: Osseous demineralization. No definite acute bony abnormalities identified within limitations of exam as above.  Original Report Authenticated By: Lollie Marrow, M.D.   Ct Pelvis Wo Contrast  09/06/2011  *RADIOLOGY REPORT*  Clinical Data:  Pelvic and right hip pain.  CT PELVIS WITHOUT CONTRAST  Technique:  Multidetector CT imaging of the pelvis was performed following the standard protocol without intravenous contrast.  Comparison:  12/12/2008  Findings:  Both hips are located.  No evidence for acute fracture involving the hips or proximal femurs.  Pelvic bony structures are intact.  Small amount of gas within the urinary bladder may be iatrogenic.   No  gross abnormality to the uterus or adnexal structures.  No evidence for free fluid or pelvic lymphadenopathy. A ventral hernia containing fat.  There is interbody fusion at L4- L5 with stable anterolisthesis at L4-L5.  IMPRESSION: No acute bony abnormality to the pelvis or hips.  Stable appearance of the lower lumbar spine with anterolisthesis and interbody fusion.  Original Report Authenticated By: Richarda Overlie, M.D.   Ct Knee Left Wo Contrast  09/06/2011  *RADIOLOGY REPORT*  Clinical Data: Left knee pain.  Injury.  CT OF THE LEFT KNEE WITHOUT CONTRAST  Technique:  Multidetector CT imaging was performed according to the standard protocol. Multiplanar CT image reconstructions were also generated.  Comparison: 09/06/2011 radiographs  Findings: Moderate knee effusion noted with markedly severe tri- compartmental spurring.  Faint calcification along some of the synovial margins noted, along with faint calcification along articular cartilage.  Particularly in the lateral compartment, there is severe loss of articular space with subcortical sclerosis and cortical irregularity which is a chronic appearance.  The medial compartment appears somewhat widened, with prominent osteophytes along the articular surface of the medial femoral condyle and not simply confined to the margins.  A large osteophyte from the inferior pole of the patella extends posteriorly between the tibia and the femur as shown on image 49 of series 1002.  I favor that the faint calcification along the medial tibial plateau is due to chondrocalcinosis rather than periosteal reaction.  A well-defined fracture plane is not identified. MRI could offer increased sensitivity for subtle trabecular microfracture and marrow edema.  A small free osteochondral fragment is present between the marginal osteophytes in the lateral compartment on image 49 of series 1003.  A moderate to large Baker's cyst is present.  Fluid is noted in the popliteal recess.   IMPRESSION:  1.  Marked tri-compartmental spurring with suspected chondrocalcinosis and some faint calcification along the synovial lining in the knee joint. CPPD arthropathy is suspected as a potential etiology. 2.  Knee joint effusion with moderate to large Baker's cyst. 3.  A discrete fracture is not identified, although given the degree of marked spurring and cortical irregularity, the possibility of a subtle impaction cannot be completely discounted. MRI can offer increased sensitivity for marrow edema and trabecular microfracture, if clinically warranted. 4.  A large patellar spur extends posteriorly and between the tibia and femur.  This could impinge during extension.  Original Report Authenticated By: Dellia Cloud, M.D.   Dg Knee Complete 4 Views Left  09/06/2011  *RADIOLOGY REPORT*  Clinical Data: Knee pain, altered mental status  LEFT KNEE - COMPLETE 4+ VIEW  Comparison: None  Findings: Patient uncooperative for imaging. Extensive atherosclerotic calcification. Tricompartmental osteoarthritic changes with joint space narrowing and spur formation. No acute fracture,  dislocation or bone destruction. Question knee joint effusion.  IMPRESSION: Osseous demineralization with advanced tricompartmental osteoarthritic changes and suspected small knee joint effusion. No definite acute bony abnormalities. Extensive atherosclerotic calcification.  Original Report Authenticated By: Lollie Marrow, M.D.    Medications: I have reviewed the patient's current medications.   Patient Active Hospital Problem List:  1Knee effusion, left (09/07/2011)  MRI not done yet. If MRI negative for fracture , will order arthrocentesis. Continue with Pain medications.  2Hypokalemia (09/07/2011) Repleted. Repeat Bmet in am.   3Anemia (09/07/2011): Iron deficiency. I will start ferrous sulfate and B12 supplement.   4Dementia (09/07/2011) With agitation. I will order PRN haldol. Avoid ativan. EKG prior to haldol.    5-Fall: PT , OT evaluation. Waiting MRI result. 6.Right hip pain: I will get MRI right hip, due to severe pain after fall. MRI hopefully today.  7-Pyuria:  I will start ciprofloxacin. Urine culture with Gram negative rods.   8.DVT prophylaxis: SCD, monitor HB.    LOS: 2 days   Deniyah Dillavou M.D.  [facility] 09/08/2011, 10:32 AM

## 2011-09-09 LAB — SYNOVIAL CELL COUNT + DIFF, W/ CRYSTALS
Crystals, Fluid: NONE SEEN
Lymphocytes-Synovial Fld: 35 % — ABNORMAL HIGH (ref 0–20)
Monocyte-Macrophage-Synovial Fluid: 58 % (ref 50–90)
Neutrophil, Synovial: 7 % (ref 0–25)
WBC, Synovial: 222 /mm3 — ABNORMAL HIGH (ref 0–200)

## 2011-09-09 MED ORDER — CIPROFLOXACIN HCL 500 MG PO TABS
500.0000 mg | ORAL_TABLET | Freq: Two times a day (BID) | ORAL | Status: DC
  Administered 2011-09-10 – 2011-09-12 (×5): 500 mg via ORAL
  Filled 2011-09-09 (×7): qty 1

## 2011-09-09 MED ORDER — LORAZEPAM BOLUS VIA INFUSION: 0.5000 mg | INTRAVENOUS | Status: DC | PRN

## 2011-09-09 MED ORDER — LORAZEPAM 0.5 MG PO TABS
ORAL_TABLET | ORAL | Status: AC
  Administered 2011-09-09: 0.5 mg via ORAL
  Filled 2011-09-09: qty 1

## 2011-09-09 MED ORDER — METHYLPREDNISOLONE ACETATE PF 40 MG/ML IJ SUSP
40.0000 mg | Freq: Once | INTRAMUSCULAR | Status: AC
  Administered 2011-09-09: 40 mg via INTRA_ARTICULAR
  Filled 2011-09-09: qty 1

## 2011-09-09 MED ORDER — LORAZEPAM 0.5 MG PO TABS
0.5000 mg | ORAL_TABLET | Freq: Once | ORAL | Status: AC
  Administered 2011-09-09: 0.5 mg via ORAL

## 2011-09-09 NOTE — Progress Notes (Signed)
Subjective:  Patient lying in bed in not distress, comfortable. Relates right hip pain is better. Still with left knee pain. Patient get agitated sometimes. Family relates patient is afraid of men. Will ask nurse to be present when the orthopedic see patient. Patient might need sedation prior to arthrocentesis.   Objective: Filed Vitals:   09/08/11 2045 09/09/11 0440 09/09/11 0527 09/09/11 1100  BP: 128/75 154/66    Pulse: 87 75    Temp: 99.1 F (37.3 C) 98 F (36.7 C)    TempSrc: Oral     Resp: 16 16    Height:    5\' 3"  (1.6 m)  Weight:  55.021 kg (121 lb 4.8 oz) 54.885 kg (121 lb)   SpO2: 93% 94%     Weight change:   Intake/Output Summary (Last 24 hours) at 09/09/11 1559 Last data filed at 09/09/11 0300  Gross per 24 hour  Intake     80 ml  Output      0 ml  Net     80 ml    General: Alert, awake, oriented x3, in no acute distress.  HEENT: No bruits, no goiter.  Heart: Regular rate and rhythm, without murmurs, rubs, gallops.  Lungs: Crackles left side, bilateral air movement.  Abdomen: Soft, nontender, nondistended, positive bowel sounds.  Neuro: Grossly intact, nonfocal. Extremities: left Knee wit swelling. No redness.    Lab Results:  [facility] 09/07/11 0330  NA 138  K 4.2  CL 106  CO2 25  GLUCOSE 101*  BUN 29*  CREATININE 0.67  CALCIUM 8.8  MG 2.4  PHOS --    Basename 09/07/11 0330  AST 52*  ALT 38*  ALKPHOS 60  BILITOT 0.4  PROT 5.3*  ALBUMIN 2.6*   Basename 09/07/11 0330  WBC 5.7  NEUTROABS 3.6  HGB 10.2*  HCT 30.8*  MCV 99.0  PLT 218    Basename 09/07/11 0352  VITAMINB12 246  FOLATE 16.4  FERRITIN 203  TIBC 209*  IRON 35*  RETICCTPCT 1.4    Micro Results: Recent Results (from the past 240 hour(s))  URINE CULTURE     Status: Normal   Collection Time   09/06/11  1:19 PM      Component Value Range Status Comment   Specimen Description URINE, CATHETERIZED   Final    Special Requests NONE   Final    Setup Time [id number]    Final    Colony Count >=100,000 COLONIES/ML   Final    Culture KLEBSIELLA PNEUMONIAE   Final    Report Status 09/08/2011 FINAL   Final    Organism ID, Bacteria KLEBSIELLA PNEUMONIAE   Final     Studies/Results: Mr Knee Left  Wo Contrast  09/08/2011  *RADIOLOGY REPORT*  Clinical Data: Left knee pain and swelling.  MRI OF THE LEFT KNEE WITHOUT CONTRAST  Technique:  Multiplanar, multisequence MR imaging was performed. No intravenous contrast was administered.  Comparison: None.  Findings: There is severe tricompartmental osteoarthritis.  Large knee effusion is present.  Large Baker's cyst.  Relative preservation of patellar cartilage with large marginal osteophytes off the trochlea.  Chronic osteoarthritic remodeling of the lateral tibial plateau. Lateral compartment articular cartilage is completely denuded. Severe medial compartment osteoarthritis is present with marginal and central osteophyte ptosis.  Reactive bone marrow edema is present in the lateral tibial plateau.  Degenerative synovitis.  The ACL is not visualized.  The study is technically degraded by patient motion and noncompliance.  PCL is present and appears  intact.  The medial collateral ligament intact.  Lateral collateral ligament complex intact.  The effusion extends along the popliteus sheath.  Medial meniscus is intact.  Lateral meniscus is macerated.  IMPRESSION: 1.  Severe tricompartmental osteoarthritis, worst in the lateral compartment with chronic osteoarthritic remodeling of the lateral tibial plateau. 2.  Maceration of the lateral meniscus. 3.  Large effusion, degenerative synovitis and Baker's cyst.  Original Report Authenticated By: Andreas Newport, M.D.   Mr Hip Right Wo Contrast  09/08/2011  *RADIOLOGY REPORT*  Clinical Data: Right hip pain.  Fall.  MRI OF THE RIGHT HIP WITHOUT CONTRAST  Technique:  Multiplanar, multisequence MR imaging was performed. No intravenous contrast was administered.  Comparison: 09/06/2011.   Findings: Visceral pelvis shows a distended urinary bladder with small cellule off the right posterior aspect of the bladder.  The sacral bone marrow signal is normal.  Bilateral hip osteoarthritis is present, left greater than right.  There is no femoral neck fracture.  Small hip effusions may be degenerative or post- traumatic.  The obturator rings are intact.  Medial right adductor compartment strain is present adjacent to the pubic symphysis. Common hamstring origins appear within normal limits.  Partially visualized lumbar spondylosis with levoconvex scoliosis.  L4-L5 interbody bone graft is present. Bilateral hip girdle muscular atrophy is present.  There is a partial tear of the distal right gluteus minimus tendon near the attachment on the greater trochanter.  There is fluid surrounding this region.  IMPRESSION: Negative for fracture. Right adductor compartment muscular strain and partial tear of the right gluteus minimus tendon near the attachment on the greater trochanter.  Bilateral hip osteoarthritis with small bilateral hip effusions.  Original Report Authenticated By: Andreas Newport, M.D.    Medications: I have reviewed the patient's current medications.  1Knee effusion, left (09/07/2011) Continue with Pain medications. No NSAID due to history of ulcers. I will consult ortho for arthrocentesis, and further recommendation. MRI with severe osteoarthritis and maceration of lateral meniscus.   2Hypokalemia (09/07/2011) Repleted. Repeat Bmet pending.   3Anemia (09/07/2011): Iron deficiency. I will start ferrous sulfate and B12 supplement.   4Dementia (09/07/2011) With agitation. I will order PRN haldol. Avoid ativan. EKG prior to haldol.  5-Fall: PT , OT evaluation in am after ortho see patient.  6.Right hip pain: Right adductor muscular strain, and partial tear of right gluteus minimus tendon. PT per ortho recommendation.  7-UTI: I will change  Ciprofloxacin to PO. Urine culture with  klebsiella waiting sensitivity.  8.DVT prophylaxis: SCD, monitor HB. Will consider start Lovenox for DVT prophylaxis on 11-25.     LOS: 3 days   Pace Lamadrid M.D.  [facility] 09/09/2011, 3:59 PM

## 2011-09-09 NOTE — H&P (Signed)
Pt pulled I.V out again.  Refused a restart and refused lab to draw blood this a.m. M.D. On call notified and is aware, no new orders were given will continue to monitor. And report to a.m. Nurse.

## 2011-09-09 NOTE — Consult Note (Signed)
NAME: Brittany [name] MRN:   [id number] DOB:   Sep 06, 1924     Orthopaedic Consult  CHIEF COMPLAINT:  Left knee pain and effusion s/p fall at home 2 days ago  HISTORY:   Brittany [name] a 77 y.o. female  with left  Knee Pain Patient presents with knee swelling involving the left knee. Onset of the symptoms was 2 days ago. Inciting event: injured during a fall while . Current symptoms include pain located left knee and swelling. Pain is aggravated by standing. Patient has had prior knee problems. Evaluation to date: plain films: abnormal severe tricompartmental osteoarthritis and MRI: abnormal severe tricompartmental osteoarthritis and bakers cyst with large knee effusion.   PAST MEDICAL HISTORY:   Past Medical History  Diagnosis Date  . Murmur, cardiac   . Mitral valve prolapse   . Arthritis   . DEMENTIA   . Stroke     hx 2 mini strokes    PAST SURGICAL HISTORY:   Past Surgical History  Procedure Date  . Knee arthroscopy     which knee unknown  . Back surgery   . Appendectomy     years ago    MEDICATIONS:   Medications Prior to Admission  Medication Dose Route Frequency Provider Last Rate Last Dose  . acetaminophen (TYLENOL) tablet 500 mg  500 mg Oral Q6H PRN Hartley Barefoot, MD   500 mg at 09/09/11 0427  . cefTRIAXone (ROCEPHIN) 1 g in dextrose 5 % 50 mL IVPB  1 g Intravenous Once Doug Sou, MD   1 g at 09/06/11 1649  . ciprofloxacin (CIPRO) tablet 500 mg  500 mg Oral BID Belkys Regalado, MD      . feeding supplement (ENSURE CLINICAL STRENGTH) liquid 237 mL  237 mL Oral TID BM Charles Nwaokocha   237 mL at 09/09/11 1416  . ferrous sulfate tablet 325 mg  325 mg Oral BID WC Belkys Regalado, MD   325 mg at 09/09/11 0924  . haloperidol (HALDOL) tablet 1 mg  1 mg Oral Q8H PRN Belkys Regalado, MD      . influenza  inactive virus vaccine (FLUZONE/FLUARIX) injection 0.5 mL  0.5 mL Intramuscular Tomorrow-1000 Hurman Horn, MD   0.5 mL at 09/07/11 1156  . LORazepam (ATIVAN)  injection 1 mg  1 mg Intravenous Once Doug Sou, MD   1 mg at 09/06/11 1448  . methylPREDNISolone acetate PF (DEPO-MEDROL) injection 40 mg  40 mg Intra-articular Once [employer]      . pantoprazole (PROTONIX) EC tablet 40 mg  40 mg Oral Q1200 Charles Nwaokocha   40 mg at 09/09/11 1155  . potassium chloride 20 MEQ/15ML (10%) liquid 40 mEq  40 mEq Oral Once Doug Sou, MD   40 mEq at 09/06/11 1726  . vitamin B-12 (CYANOCOBALAMIN) tablet 100 mcg  100 mcg Oral Daily Hartley Barefoot, MD   100 mcg at 09/09/11 0924  . DISCONTD: 0.9 %  sodium chloride infusion   Intravenous Continuous Doug Sou, MD 125 mL/hr at 09/07/11 0703    . DISCONTD: ciprofloxacin (CIPRO) IVPB 400 mg  400 mg Intravenous Q12H Belkys Regalado, MD   400 mg at 09/08/11 2015  . DISCONTD: haloperidol lactate (HALDOL) injection 1 mg  1 mg Intravenous BID PRN Belkys Regalado, MD      . DISCONTD: ibuprofen (ADVIL,MOTRIN) tablet 200 mg  200 mg Oral TID Hartley Barefoot, MD      . DISCONTD: ibuprofen (ADVIL,MOTRIN) tablet 400 mg  400 mg Oral Q6H PRN  Charles Nwaokocha   400 mg at 09/07/11 0350  . DISCONTD: LORazepam (ATIVAN) 2 MG/ML concentrated solution 0.5 mg  0.5 mg Oral Q6H PRN Belkys Regalado, MD      . DISCONTD: LORazepam (ATIVAN) tablet 0.5 mg  0.5 mg Sublingual Q6H PRN Belkys Regalado, MD      . DISCONTD: potassium chloride SA (K-DUR,KLOR-CON) CR tablet 20 mEq  20 mEq Oral BID Charles Nwaokocha   20 mEq at 09/06/11 2123  . DISCONTD: potassium chloride SA (K-DUR,KLOR-CON) CR tablet 40 mEq  40 mEq Oral Once Doug Sou, MD       No current outpatient prescriptions on file as of 09/09/2011.    ALLERGIES:   Allergies  Allergen Reactions  . Penicillins Hives    REVIEW OF SYSTEMS:   Negative except pain in bilateral knees, left greater than right, swelling left knee.  FAMILY HISTORY:  History reviewed. No pertinent family history.  SOCIAL HISTORY:   reports that she has never smoked. She has never used smokeless  tobacco. She reports that she does not drink alcohol or use illicit drugs.  PHYSICAL EXAM:  Patient is alert and cooperative, lying in bed in no acute distress.  Examination of bilateral lower extremities shows she is neurovascularly intact.  She is able to actively range the knees but is significantly limited secondary to pain.  She has moderate size knee effusion on the left, no warmth or erythema.  Tender to palpation along the medial/lateral/and patellofemoral joints.  Able to dorsiflex and planterflex ankle without pain.  Strength testing is difficult to assess secondary to pain.  Secondary exam shows no tenderness of upper extremities with good range of motion.  I am able to passively range the right hip/knee/ankle with some discomfort noted in the lateral thigh unclear if coming from the hip or knee.  Also range left hip/ankle without pain.     LABORATORY STUDIES:  Basename 09/07/11 0330  WBC 5.7  HGB 10.2*  HCT 30.8*  PLT 218     Basename 09/07/11 0330  NA 138  K 4.2  CL 106  CO2 25  GLUCOSE 101*  BUN 29*  CREATININE 0.67  CALCIUM 8.8    STUDIES/RESULTS:  Dg Chest 1 View  09/06/2011  *RADIOLOGY REPORT*  Clinical Data: Fall and altered mental status.  CHEST - 1 VIEW  Comparison: 02/03/2011  Findings: Limited evaluation of the left upper chest due to the patient's overlying jaw.  The visualized lungs are clear.  Heart and mediastinum are within normal limits.  There is marked scoliosis of the thoracolumbar spine. Deformity of the proximal left humerus consistent with an old fracture.  There is a fracture involving the right sixth rib of unknown age.  IMPRESSION: Limited evaluation of the left upper lung as described. Otherwise, no evidence for acute chest disease.  Right rib fracture of unknown age.  Original Report Authenticated By: Richarda Overlie, M.D.   Dg Pelvis 1-2 Views  09/06/2011  *RADIOLOGY REPORT*  Clinical Data: Pelvic pain, fall  PELVIS - 1-2 VIEW  Comparison: None   Findings: Severe osseous demineralization limits exam. Mild narrowing of hip joints bilaterally. Rotated to the right. SI joints preserved. No definite acute fracture, dislocation or bone destruction within limitations as above. Scattered pelvic phleboliths. Prior L4-L5 Ray cage fusion. Scattered atherosclerotic calcifications.  IMPRESSION: Osseous demineralization. No definite acute bony abnormalities identified within limitations of exam as above.  Original Report Authenticated By: Lollie Marrow, M.D.   Ct Pelvis Wo Contrast  09/06/2011  *  RADIOLOGY REPORT*  Clinical Data:  Pelvic and right hip pain.  CT PELVIS WITHOUT CONTRAST  Technique:  Multidetector CT imaging of the pelvis was performed following the standard protocol without intravenous contrast.  Comparison:  12/12/2008  Findings:  Both hips are located.  No evidence for acute fracture involving the hips or proximal femurs.  Pelvic bony structures are intact.  Small amount of gas within the urinary bladder may be iatrogenic.   No gross abnormality to the uterus or adnexal structures.  No evidence for free fluid or pelvic lymphadenopathy. A ventral hernia containing fat.  There is interbody fusion at L4- L5 with stable anterolisthesis at L4-L5.  IMPRESSION: No acute bony abnormality to the pelvis or hips.  Stable appearance of the lower lumbar spine with anterolisthesis and interbody fusion.  Original Report Authenticated By: Richarda Overlie, M.D.   Ct Knee Left Wo Contrast  09/06/2011  *RADIOLOGY REPORT*  Clinical Data: Left knee pain.  Injury.  CT OF THE LEFT KNEE WITHOUT CONTRAST  Technique:  Multidetector CT imaging was performed according to the standard protocol. Multiplanar CT image reconstructions were also generated.  Comparison: 09/06/2011 radiographs  Findings: Moderate knee effusion noted with markedly severe tri- compartmental spurring.  Faint calcification along some of the synovial margins noted, along with faint calcification along  articular cartilage.  Particularly in the lateral compartment, there is severe loss of articular space with subcortical sclerosis and cortical irregularity which is a chronic appearance.  The medial compartment appears somewhat widened, with prominent osteophytes along the articular surface of the medial femoral condyle and not simply confined to the margins.  A large osteophyte from the inferior pole of the patella extends posteriorly between the tibia and the femur as shown on image 49 of series 1002.  I favor that the faint calcification along the medial tibial plateau is due to chondrocalcinosis rather than periosteal reaction.  A well-defined fracture plane is not identified. MRI could offer increased sensitivity for subtle trabecular microfracture and marrow edema.  A small free osteochondral fragment is present between the marginal osteophytes in the lateral compartment on image 49 of series 1003.  A moderate to large Baker's cyst is present.  Fluid is noted in the popliteal recess.  IMPRESSION:  1.  Marked tri-compartmental spurring with suspected chondrocalcinosis and some faint calcification along the synovial lining in the knee joint. CPPD arthropathy is suspected as a potential etiology. 2.  Knee joint effusion with moderate to large Baker's cyst. 3.  A discrete fracture is not identified, although given the degree of marked spurring and cortical irregularity, the possibility of a subtle impaction cannot be completely discounted. MRI can offer increased sensitivity for marrow edema and trabecular microfracture, if clinically warranted. 4.  A large patellar spur extends posteriorly and between the tibia and femur.  This could impinge during extension.  Original Report Authenticated By: Dellia Cloud, M.D.   Mr Knee Left  Wo Contrast  09/08/2011  *RADIOLOGY REPORT*  Clinical Data: Left knee pain and swelling.  MRI OF THE LEFT KNEE WITHOUT CONTRAST  Technique:  Multiplanar, multisequence MR  imaging was performed. No intravenous contrast was administered.  Comparison: None.  Findings: There is severe tricompartmental osteoarthritis.  Large knee effusion is present.  Large Baker's cyst.  Relative preservation of patellar cartilage with large marginal osteophytes off the trochlea.  Chronic osteoarthritic remodeling of the lateral tibial plateau. Lateral compartment articular cartilage is completely denuded. Severe medial compartment osteoarthritis is present with marginal and central osteophyte  ptosis.  Reactive bone marrow edema is present in the lateral tibial plateau.  Degenerative synovitis.  The ACL is not visualized.  The study is technically degraded by patient motion and noncompliance.  PCL is present and appears intact.  The medial collateral ligament intact.  Lateral collateral ligament complex intact.  The effusion extends along the popliteus sheath.  Medial meniscus is intact.  Lateral meniscus is macerated.  IMPRESSION: 1.  Severe tricompartmental osteoarthritis, worst in the lateral compartment with chronic osteoarthritic remodeling of the lateral tibial plateau. 2.  Maceration of the lateral meniscus. 3.  Large effusion, degenerative synovitis and Baker's cyst.  Original Report Authenticated By: Andreas Newport, M.D.   Mr Hip Right Wo Contrast  09/08/2011  *RADIOLOGY REPORT*  Clinical Data: Right hip pain.  Fall.  MRI OF THE RIGHT HIP WITHOUT CONTRAST  Technique:  Multiplanar, multisequence MR imaging was performed. No intravenous contrast was administered.  Comparison: 09/06/2011.  Findings: Visceral pelvis shows a distended urinary bladder with small cellule off the right posterior aspect of the bladder.  The sacral bone marrow signal is normal.  Bilateral hip osteoarthritis is present, left greater than right.  There is no femoral neck fracture.  Small hip effusions may be degenerative or post- traumatic.  The obturator rings are intact.  Medial right adductor compartment strain is  present adjacent to the pubic symphysis. Common hamstring origins appear within normal limits.  Partially visualized lumbar spondylosis with levoconvex scoliosis.  L4-L5 interbody bone graft is present. Bilateral hip girdle muscular atrophy is present.  There is a partial tear of the distal right gluteus minimus tendon near the attachment on the greater trochanter.  There is fluid surrounding this region.  IMPRESSION: Negative for fracture. Right adductor compartment muscular strain and partial tear of the right gluteus minimus tendon near the attachment on the greater trochanter.  Bilateral hip osteoarthritis with small bilateral hip effusions.  Original Report Authenticated By: Andreas Newport, M.D.   Dg Knee Complete 4 Views Left  09/06/2011  *RADIOLOGY REPORT*  Clinical Data: Knee pain, altered mental status  LEFT KNEE - COMPLETE 4+ VIEW  Comparison: None  Findings: Patient uncooperative for imaging. Extensive atherosclerotic calcification. Tricompartmental osteoarthritic changes with joint space narrowing and spur formation. No acute fracture, dislocation or bone destruction. Question knee joint effusion.  IMPRESSION: Osseous demineralization with advanced tricompartmental osteoarthritic changes and suspected small knee joint effusion. No definite acute bony abnormalities. Extensive atherosclerotic calcification.  Original Report Authenticated By: Lollie Marrow, M.D.    ASSESSMENT:  End stage osteoarthritis left knee with effusion        Active Problems:  Knee effusion, left  Generalized pain  Hypokalemia  Anemia  Dementia    PLAN:  Aspiration/Injection left knee effusion.  Risks and benefits of fluid aspiration and corticosteroid injection were discussed and patient wishes to proceed.  See procedure note below.  Scans are negative for fracture, we believe patient has excerbation of severe tricompartmental osteoarthritis left knee with effusion.  From an orthopaedic standpoint patient may be  weight bearing as tolerated encourage up with PT with a walker.  It was our pleasure consulting Ms. Rikard we will continue to monitor progress while inpatient.  If continued problems with the knee she may follow up with Dr. Madelon Lips as an outpatient prn.    Procedure Note:  Left knee aspiration/injection  Patient was supine in her bed.  Proper injection site was identified at the superolateral border of the patella then cleaned and prepped in a  sterile fashion using betadine swabs.  Using a 25 gauge needle site was then injected with 1% lidocaine 8ml for local numbing. Site was then again cleaned and prepped using betadine swabs then using an 18 gauge needle and 60ml syringe aspirated 40 ml of clear/bloody fluid which was sent for analysis.  Using the same needle the knee was then injected using 40mg  depomedrol and 5ml marcaine.  Patient tolerated the procedure well without complication.        Margart Sickles 09/09/2011. 5:41 PM

## 2011-09-09 NOTE — Progress Notes (Signed)
Per son's report, Pt. Has a hx. Of being fearful of men in the medical setting. Family requests that female staff be present when female staff is in the room. Pt. Also becomes confused and agitated easily, but will calm down when procedures or meds are explained.  Pt. Tolerated needle aspiration of Lt. Knee well. Pt. Was pre-medicated w/ Ativan PO prior to procedure and remained calm, but not sedated. Area dressed w/ sterile gauze. Fluid obtained from needle aspiration sent to lab. Family at bedside.

## 2011-09-10 LAB — BASIC METABOLIC PANEL
BUN: 21 mg/dL (ref 6–23)
Calcium: 9.2 mg/dL (ref 8.4–10.5)
GFR calc non Af Amer: 84 mL/min — ABNORMAL LOW (ref >90)
Glucose, Bld: 186 mg/dL — ABNORMAL HIGH (ref 70–99)
Potassium: 4.2 mEq/L (ref 3.5–5.1)

## 2011-09-10 LAB — CBC
Hemoglobin: 11.7 g/dL — ABNORMAL LOW (ref 12.0–15.0)
MCH: 32.5 pg (ref 26.0–34.0)
MCHC: 32.7 g/dL (ref 30.0–36.0)

## 2011-09-10 MED ORDER — ENOXAPARIN SODIUM 30 MG/0.3ML ~~LOC~~ SOLN
30.0000 mg | SUBCUTANEOUS | Status: DC
  Administered 2011-09-10 – 2011-09-12 (×2): 30 mg via SUBCUTANEOUS
  Filled 2011-09-10 (×3): qty 0.3

## 2011-09-10 NOTE — Progress Notes (Signed)
Subjective: Pleasently confused. Son at bedside. Patient calm, not distress. Denies pain. Good spirit.  Objective: Filed Vitals:   09/09/11 1100 09/09/11 1415 09/09/11 2215 09/10/11 0632  BP:  114/64 131/66 145/65  Pulse:  78 84 111  Temp:  98.5 F (36.9 C) 98.3 F (36.8 C) 98.4 F (36.9 C)  TempSrc:  Oral Oral Oral  Resp:  18 18 20   Height: 5\' 3"  (1.6 m)     Weight:      SpO2:  96% 94% 65%   Weight change:   Intake/Output Summary (Last 24 hours) at 09/10/11 0949 Last data filed at 09/10/11 1610  Gross per 24 hour  Intake      0 ml  Output   1200 ml  Net  -1200 ml    General: Alert, awake,  in no acute distress.  HEENT: No bruits, no goiter.  Heart: Regular rate and rhythm, without murmurs, rubs, gallops.  Lungs: Crackles left side, bilateral air movement.  Abdomen: Soft, nontender, nondistended, positive bowel sounds.  Neuro: Grossly intact, nonfocal. Extremities; Left knee with decrease swelling.    Lab Results:  Micro Results: Recent Results (from the past 240 hour(s))  URINE CULTURE     Status: Normal   Collection Time   09/06/11  1:19 PM      Component Value Range Status Comment   Specimen Description URINE, CATHETERIZED   Final    Special Requests NONE   Final    Setup Time [id number]   Final    Colony Count >=100,000 COLONIES/ML   Final    Culture KLEBSIELLA PNEUMONIAE   Final    Report Status 09/08/2011 FINAL   Final    Organism ID, Bacteria KLEBSIELLA PNEUMONIAE   Final   BODY FLUID CULTURE     Status: Normal (Preliminary result)   Collection Time   09/09/11  6:50 PM      Component Value Range Status Comment   Specimen Description SYNOVIAL   Final    Special Requests Normal   Final    Gram Stain     Final    Value: WBC PRESENT, PREDOMINANTLY PMN     NO ORGANISMS SEEN   Culture PENDING   Incomplete    Report Status PENDING   Incomplete     Studies/Results: Mr Knee Left  Wo Contrast  09/08/2011  *RADIOLOGY REPORT*  Clinical Data: Left knee  pain and swelling.  MRI OF THE LEFT KNEE WITHOUT CONTRAST  Technique:  Multiplanar, multisequence MR imaging was performed. No intravenous contrast was administered.  Comparison: None.  Findings: There is severe tricompartmental osteoarthritis.  Large knee effusion is present.  Large Baker's cyst.  Relative preservation of patellar cartilage with large marginal osteophytes off the trochlea.  Chronic osteoarthritic remodeling of the lateral tibial plateau. Lateral compartment articular cartilage is completely denuded. Severe medial compartment osteoarthritis is present with marginal and central osteophyte ptosis.  Reactive bone marrow edema is present in the lateral tibial plateau.  Degenerative synovitis.  The ACL is not visualized.  The study is technically degraded by patient motion and noncompliance.  PCL is present and appears intact.  The medial collateral ligament intact.  Lateral collateral ligament complex intact.  The effusion extends along the popliteus sheath.  Medial meniscus is intact.  Lateral meniscus is macerated.  IMPRESSION: 1.  Severe tricompartmental osteoarthritis, worst in the lateral compartment with chronic osteoarthritic remodeling of the lateral tibial plateau. 2.  Maceration of the lateral meniscus. 3.  Large effusion, degenerative synovitis  and Baker's cyst.  Original Report Authenticated By: Andreas Newport, M.D.   Mr Hip Right Wo Contrast  09/08/2011  *RADIOLOGY REPORT*  Clinical Data: Right hip pain.  Fall.  MRI OF THE RIGHT HIP WITHOUT CONTRAST  Technique:  Multiplanar, multisequence MR imaging was performed. No intravenous contrast was administered.  Comparison: 09/06/2011.  Findings: Visceral pelvis shows a distended urinary bladder with small cellule off the right posterior aspect of the bladder.  The sacral bone marrow signal is normal.  Bilateral hip osteoarthritis is present, left greater than right.  There is no femoral neck fracture.  Small hip effusions may be degenerative  or post- traumatic.  The obturator rings are intact.  Medial right adductor compartment strain is present adjacent to the pubic symphysis. Common hamstring origins appear within normal limits.  Partially visualized lumbar spondylosis with levoconvex scoliosis.  L4-L5 interbody bone graft is present. Bilateral hip girdle muscular atrophy is present.  There is a partial tear of the distal right gluteus minimus tendon near the attachment on the greater trochanter.  There is fluid surrounding this region.  IMPRESSION: Negative for fracture. Right adductor compartment muscular strain and partial tear of the right gluteus minimus tendon near the attachment on the greater trochanter.  Bilateral hip osteoarthritis with small bilateral hip effusions.  Original Report Authenticated By: Andreas Newport, M.D.    Medications: I have reviewed the patient's current medications.  1Knee effusion, left (09/07/2011); Post trauma.  No NSAID due to history of ulcer.  Appreciate ortho help SP arthrocentesis.   2Hypokalemia (09/07/2011) Repleted. Patient refused lab drawn.   3Anemia (09/07/2011): Iron deficiency.  Continue with ferrous sulfate and B12 supplement.   4Dementia (09/07/2011) With agitation. I will order PRN haldol. Avoid ativan. EKG prior to haldol.  5-Fall: PT , OT evaluation today. 6.Right hip pain: Right adductor muscular strain, and partial tear of right gluteus minimus tendon. PT per ortho recommendation.  7-UTI: I will change Ciprofloxacin to PO. Urine culture with klebsiella waiting sensitivity.  8.DVT prophylaxis: SCD, monitor HB. Will  start Lovenox for DVT prophylaxis on 11-25. Will follow PT recommendation for disposition.    LOS: 4 days   Antonea Gaut M.D.  [facility] 09/10/2011, 9:49 AM

## 2011-09-10 NOTE — Progress Notes (Signed)
Patient ID: Brittany [name], female   DOB: Aug 18, 1924, 77 y.o.   MRN: [id number] Improved with injection,up wbat, please reconsult if you need me.

## 2011-09-11 MED ORDER — ENSURE IMMUNE HEALTH PO LIQD
237.0000 mL | Freq: Three times a day (TID) | ORAL | Status: DC
  Administered 2011-09-11: 237 mL via ORAL
  Administered 2011-09-12 (×2): via ORAL

## 2011-09-11 NOTE — Progress Notes (Signed)
OT Cancel Note: Orders received and chart reviewed. Pt refused OT/PT eval today despite maximum verbal encouragement  to participate and reassurance from staff/family. Will re-attempt as able.

## 2011-09-11 NOTE — Progress Notes (Signed)
Physical Therapy Evaluation Patient Details Name: Brittany [name] MRN: [id number] DOB: Mar 15, 1924 Today's Date: 09/11/2011 1130-1200 Ev2  Problem List:  Patient Active Problem List  Diagnoses  . Knee effusion, left  . Generalized pain  . Hypokalemia  . Anemia  . Dementia    Past Medical History:  Past Medical History  Diagnosis Date  . Murmur, cardiac   . Mitral valve prolapse   . Arthritis   . DEMENTIA   . Stroke     hx 2 mini strokes   Past Surgical History:  Past Surgical History  Procedure Date  . Knee arthroscopy     which knee unknown  . Back surgery   . Appendectomy     years ago    PT Assessment/Plan/Recommendation PT Assessment Clinical Impression Statement: Patient admitted s/p fall with left knee effusion, deformity, but no acute fracture presents with decreased mobility limiting safety and independence and will need SNF level rehab to maximize safety and independence for eventual discharge into alternate living facility due to cognitive decline. PT Recommendation/Assessment: Patient will need skilled PT in the acute care venue PT Problem List: Decreased strength;Decreased knowledge of use of DME;Decreased activity tolerance;Decreased mobility;Pain Barriers to Discharge: Decreased caregiver support PT Therapy Diagnosis : Abnormality of gait;Generalized weakness PT Plan PT Frequency: Min 3X/week PT Treatment/Interventions: Gait training;DME instruction;Patient/family education;Functional mobility training;Therapeutic exercise;Balance training PT Recommendation Follow Up Recommendations: Skilled nursing facility Equipment Recommended: Defer to next venue PT Goals  Acute Rehab PT Goals PT Goal Formulation: With patient/family Time For Goal Achievement: 2 weeks Pt will go Supine/Side to Sit: with min assist PT Goal: Supine/Side to Sit - Progress: Progressing toward goal Pt will go Sit to Supine/Side: with min assist PT Goal: Sit to Supine/Side -  Progress: Progressing toward goal Pt will Transfer Sit to Stand/Stand to Sit: with min assist PT Transfer Goal: Sit to Stand/Stand to Sit - Progress: Progressing toward goal Pt will Ambulate: 16 - 50 feet;with min assist;with rolling walker PT Goal: Ambulate - Progress: Progressing toward goal  PT Evaluation Precautions/Restrictions  Precautions Precautions: Fall Prior Functioning  Home Living Receives Help From: Personal care attendant (hired help from [employer] couple of hours) Type of Home: House Home Layout: One level Home Adaptive Equipment: Walker - rolling Prior Function Level of Independence: Requires assistive device for independence;Independent with gait;Independent with transfers Cognition Cognition Arousal/Alertness: Awake/alert Overall Cognitive Status: Impaired Memory: Appears impaired Memory Deficits: Patient perseverates on same issues returning to same argument about 4 times in 10 minutes span Orientation Level: Disoriented to situation;Disoriented to place;Disoriented to time;Oriented to person Safety/Judgement: Decreased awareness of safety precautions Decreased Safety/Judgement: Decreased awareness of need for assistance Safety/Judgement - Other Comments: reluctant to use walker due to fear..wants to hold hand, reach for grabbar far away, etc Awareness of Deficits: Decreased awareness of deficits Awareness of Deficits - Other Comments: in bed felt she could move perfectly fine, upon arising verbalized fear of falling Problem Solving: Requires assistance for problem solving Cognition - Other Comments: However, even with assistance quickly forgets reason why it is important to get up and continues with paranoid statements like, "You don't care about me.  You just want to go off and laugh about me." Sensation/Coordination   Extremity Assessment RLE Assessment RLE Assessment: Exceptions to [facility] RLE AROM (degrees) Overall AROM Right Lower Extremity: Within  functional limits for tasks assessed RLE Overall AROM Comments: Patient moves leg in supine with assist [facility] RLE Strength RLE Overall Strength: Deficits RLE Overall Strength Comments:  weakness evident despite patient uncooperative with formal strength assessment LLE Assessment LLE Assessment: Exceptions to [facility] LLE AROM (degrees) Overall AROM Left Lower Extremity: Deficits LLE Overall AROM Comments: Patient with severe valgus deformity left knee with signs of instability LLE Strength LLE Overall Strength: Deficits LLE Overall Strength Comments: weakness evident despite patient not cooperative with formal strength assessment Mobility (including Balance) Bed Mobility Bed Mobility: Yes Supine to Sit: 3: Mod assist;HOB elevated (Comment degrees) Supine to Sit Details (indicate cue type and reason): HOB 45 degrees.  Assist to bring legs to edge of bed and to lift upper body Sit to Supine - Right: 3: Mod assist Sit to Supine - Right Details (indicate cue type and reason): assist for both LE's lifting into bed Transfers Transfers: Yes Sit to Stand: 1: +2 Total assist;From bed;With upper extremity assist (pt = 40%; mod assist off 3:1 with use of armrests) Sit to Stand Details (indicate cue type and reason): cues for use of armrests off 3:1, and not to pull up on walker Stand to Sit: 3: Mod assist;To chair/3-in-1;With upper extremity assist;With armrests Stand to Sit Details: cues for safety with lowering slowly Ambulation/Gait Ambulation/Gait: Yes Ambulation/Gait Assistance: 3: Mod assist Ambulation/Gait Assistance Details (indicate cue type and reason): assist for walker progression, balance and direction of walker Ambulation Distance (Feet): 12 Feet (times two) Assistive device: Rolling walker Gait Pattern: Step-to pattern;Decreased stance time - left;Decreased step length - right;Trunk flexed    Exercise    End of Session PT - End of Session Activity Tolerance: Patient tolerated  treatment well Patient left: in bed General Behavior During Session: [facility] for tasks performed Cognition: Impaired, at baseline  [facility] 09/11/2011, 2:10 PM

## 2011-09-11 NOTE — Progress Notes (Signed)
Nutrition Screen for Low Braden  - Met with pt who reports eating 2 meals/day and drinking Ensure Plus TID. Pt reports stable weight PTA. Pt denies any problems chewing or swallowing foods. Pt with BMI of 21.4, appears thin, however pt eating well. Pt currently on heart healthy diet, ate 85% of breakfast, and ate 50-90% of meals over the weekend. Will add Ensure per pet preference. No nutritional diagnosis at this time. Will monitor intake.   Dietitian # [phone]

## 2011-09-11 NOTE — Discharge Summary (Addendum)
Admit date: 09/06/2011 Discharge date: 09/11/2011  Primary Care Physician:  No primary provider on file.   Discharge Diagnoses:   1- End stage osteoarthritis left knee with effusion:  2-Right adductor compartment muscular strain and partial tear of the right gluteus minimus tendon near the attachment on the greater trochanter.  Bilateral hip osteoarthritis with small bilateral hip effusions. 3-Hypokalemia:  4-Anemia 5-UTI  Past Medical History   Diagnosis  Date   .  Murmur, cardiac    .  Mitral valve prolapse    .  Arthritis    .  DEMENTIA    .  Stroke      hx 2 mini strokes    Past Surgical History   Procedure  Date   .  Knee arthroscopy      which knee unknown   .  Back surgery    .  Appendectomy      years ago      DISCHARGE MEDICATION: Current Discharge Medication List       Details  acetaminophen (TYLENOL) 500 MG tablet Take 500 mg by mouth every 6 (six) hours as needed. pain     Ensure Plus (ENSURE PLUS) LIQD Take 237 mLs by mouth 3 (three) times daily between meals.      Ferrous Sulfate 325 mg po BID. Cyanocobalamin 100 mcg po daily.        Consults:   Karsten Fells.    SIGNIFICANT DIAGNOSTIC STUDIES:  Dg Chest 1 View  09/06/2011  *RADIOLOGY REPORT*  Clinical Data: Fall and altered mental status.  CHEST - 1 VIEW  Comparison: 02/03/2011  Findings: Limited evaluation of the left upper chest due to the patient's overlying jaw.  The visualized lungs are clear.  Heart and mediastinum are within normal limits.  There is marked scoliosis of the thoracolumbar spine. Deformity of the proximal left humerus consistent with an old fracture.  There is a fracture involving the right sixth rib of unknown age.  IMPRESSION: Limited evaluation of the left upper lung as described. Otherwise, no evidence for acute chest disease.  Right rib fracture of unknown age.  Original Report Authenticated By: Richarda Overlie, M.D.   Dg Pelvis 1-2 Views  09/06/2011  *RADIOLOGY REPORT*   Clinical Data: Pelvic pain, fall  PELVIS - 1-2 VIEW  Comparison: None  Findings: Severe osseous demineralization limits exam. Mild narrowing of hip joints bilaterally. Rotated to the right. SI joints preserved. No definite acute fracture, dislocation or bone destruction within limitations as above. Scattered pelvic phleboliths. Prior L4-L5 Ray cage fusion. Scattered atherosclerotic calcifications.  IMPRESSION: Osseous demineralization. No definite acute bony abnormalities identified within limitations of exam as above.  Original Report Authenticated By: Lollie Marrow, M.D.   Ct Pelvis Wo Contrast  09/06/2011  *RADIOLOGY REPORT*  Clinical Data:  Pelvic and right hip pain.  CT PELVIS WITHOUT CONTRAST  Technique:  Multidetector CT imaging of the pelvis was performed following the standard protocol without intravenous contrast.  Comparison:  12/12/2008  Findings:  Both hips are located.  No evidence for acute fracture involving the hips or proximal femurs.  Pelvic bony structures are intact.  Small amount of gas within the urinary bladder may be iatrogenic.   No gross abnormality to the uterus or adnexal structures.  No evidence for free fluid or pelvic lymphadenopathy. A ventral hernia containing fat.  There is interbody fusion at L4- L5 with stable anterolisthesis at L4-L5.  IMPRESSION: No acute bony abnormality to the pelvis or hips.  Stable appearance  of the lower lumbar spine with anterolisthesis and interbody fusion.  Original Report Authenticated By: Richarda Overlie, M.D.   Ct Knee Left Wo Contrast  09/06/2011  *RADIOLOGY REPORT*  Clinical Data: Left knee pain.  Injury.  CT OF THE LEFT KNEE WITHOUT CONTRAST  Technique:  Multidetector CT imaging was performed according to the standard protocol. Multiplanar CT image reconstructions were also generated.  Comparison: 09/06/2011 radiographs  Findings: Moderate knee effusion noted with markedly severe tri- compartmental spurring.  Faint calcification along some of  the synovial margins noted, along with faint calcification along articular cartilage.  Particularly in the lateral compartment, there is severe loss of articular space with subcortical sclerosis and cortical irregularity which is a chronic appearance.  The medial compartment appears somewhat widened, with prominent osteophytes along the articular surface of the medial femoral condyle and not simply confined to the margins.  A large osteophyte from the inferior pole of the patella extends posteriorly between the tibia and the femur as shown on image 49 of series 1002.  I favor that the faint calcification along the medial tibial plateau is due to chondrocalcinosis rather than periosteal reaction.  A well-defined fracture plane is not identified. MRI could offer increased sensitivity for subtle trabecular microfracture and marrow edema.  A small free osteochondral fragment is present between the marginal osteophytes in the lateral compartment on image 49 of series 1003.  A moderate to large Baker's cyst is present.  Fluid is noted in the popliteal recess.  IMPRESSION:  1.  Marked tri-compartmental spurring with suspected chondrocalcinosis and some faint calcification along the synovial lining in the knee joint. CPPD arthropathy is suspected as a potential etiology. 2.  Knee joint effusion with moderate to large Baker's cyst. 3.  A discrete fracture is not identified, although given the degree of marked spurring and cortical irregularity, the possibility of a subtle impaction cannot be completely discounted. MRI can offer increased sensitivity for marrow edema and trabecular microfracture, if clinically warranted. 4.  A large patellar spur extends posteriorly and between the tibia and femur.  This could impinge during extension.  Original Report Authenticated By: Dellia Cloud, M.D.   Mr Knee Left  Wo Contrast  09/08/2011  *RADIOLOGY REPORT*  Clinical Data: Left knee pain and swelling.  MRI OF THE LEFT KNEE  WITHOUT CONTRAST  Technique:  Multiplanar, multisequence MR imaging was performed. No intravenous contrast was administered.  Comparison: None.  Findings: There is severe tricompartmental osteoarthritis.  Large knee effusion is present.  Large Baker's cyst.  Relative preservation of patellar cartilage with large marginal osteophytes off the trochlea.  Chronic osteoarthritic remodeling of the lateral tibial plateau. Lateral compartment articular cartilage is completely denuded. Severe medial compartment osteoarthritis is present with marginal and central osteophyte ptosis.  Reactive bone marrow edema is present in the lateral tibial plateau.  Degenerative synovitis.  The ACL is not visualized.  The study is technically degraded by patient motion and noncompliance.  PCL is present and appears intact.  The medial collateral ligament intact.  Lateral collateral ligament complex intact.  The effusion extends along the popliteus sheath.  Medial meniscus is intact.  Lateral meniscus is macerated.  IMPRESSION: 1.  Severe tricompartmental osteoarthritis, worst in the lateral compartment with chronic osteoarthritic remodeling of the lateral tibial plateau. 2.  Maceration of the lateral meniscus. 3.  Large effusion, degenerative synovitis and Baker's cyst.  Original Report Authenticated By: Andreas Newport, M.D.   Mr Hip Right Wo Contrast  09/08/2011  *RADIOLOGY REPORT*  Clinical Data: Right hip pain.  Fall.  MRI OF THE RIGHT HIP WITHOUT CONTRAST  Technique:  Multiplanar, multisequence MR imaging was performed. No intravenous contrast was administered.  Comparison: 09/06/2011.  Findings: Visceral pelvis shows a distended urinary bladder with small cellule off the right posterior aspect of the bladder.  The sacral bone marrow signal is normal.  Bilateral hip osteoarthritis is present, left greater than right.  There is no femoral neck fracture.  Small hip effusions may be degenerative or post- traumatic.  The obturator rings  are intact.  Medial right adductor compartment strain is present adjacent to the pubic symphysis. Common hamstring origins appear within normal limits.  Partially visualized lumbar spondylosis with levoconvex scoliosis.  L4-L5 interbody bone graft is present. Bilateral hip girdle muscular atrophy is present.  There is a partial tear of the distal right gluteus minimus tendon near the attachment on the greater trochanter.  There is fluid surrounding this region.  IMPRESSION: Negative for fracture. Right adductor compartment muscular strain and partial tear of the right gluteus minimus tendon near the attachment on the greater trochanter.  Bilateral hip osteoarthritis with small bilateral hip effusions.  Original Report Authenticated By: Andreas Newport, M.D.   Dg Knee Complete 4 Views Left  09/06/2011  *RADIOLOGY REPORT*  Clinical Data: Knee pain, altered mental status  LEFT KNEE - COMPLETE 4+ VIEW  Comparison: None  Findings: Patient uncooperative for imaging. Extensive atherosclerotic calcification. Tricompartmental osteoarthritic changes with joint space narrowing and spur formation. No acute fracture, dislocation or bone destruction. Question knee joint effusion.  IMPRESSION: Osseous demineralization with advanced tricompartmental osteoarthritic changes and suspected small knee joint effusion. No definite acute bony abnormalities. Extensive atherosclerotic calcification.  Original Report Authenticated By: Lollie Marrow, M.D.     Recent Results (from the past 240 hour(s))  URINE CULTURE     Status: Normal   Collection Time   09/06/11  1:19 PM      Component Value Range Status Comment   Specimen Description URINE, CATHETERIZED   Final    Special Requests NONE   Final    Setup Time [id number]   Final    Colony Count >=100,000 COLONIES/ML   Final    Culture KLEBSIELLA PNEUMONIAE   Final    Report Status 09/08/2011 FINAL   Final    Organism ID, Bacteria KLEBSIELLA PNEUMONIAE   Final   BODY FLUID  CULTURE     Status: Normal (Preliminary result)   Collection Time   09/09/11  6:50 PM      Component Value Range Status Comment   Specimen Description SYNOVIAL   Final    Special Requests Normal   Final    Gram Stain     Final    Value: WBC PRESENT, PREDOMINANTLY PMN     NO ORGANISMS SEEN   Culture NO GROWTH 1 DAY   Final    Report Status PENDING   Incomplete     BRIEF ADMITTING H & P: Patient is an 77 year old Caucasian female with history of dementia, prior CVA with no neuro deficits and ambulate with the help of a walker was said to have fallen at home. Post fall patient complained of aches and pain over the bony the body especially the knees. She denied any premonitosy symptoms prior to the onset of fall. She denied any chest pain or shortness of breath,. She also denied any systemic symptoms i.e. no fever chills no rigors. Aches and pain and knee pain was said to be getting  progressively worse and subsequently she was brought to the emergency room to be evaluated.  Hospital Course:  1- End stage osteoarthritis left knee with effusion: Patient presents with left knee and swelling after trauma, CT was negative for fracture but the recommendation was to get an MRI if high clinical suspicious for fracture. MRI which result of both was negative for fracture, showed severe osteoarthritis. Patient had arthrocentesis done on November 24 by Dr. Fara Boros. She also had a steroid injection to the left knee. Swelling and pain has improved significantly. It further orthopedic intervantion is needed patient can followup as an outpatient with Dr. Madelon Lips. Avoid NSAID due to  prior history of ulcer.  2-Right adductor compartment muscular strain and partial tear of the right gluteus minimus tendon near the attachment on the greater trochanter.  Bilateral hip osteoarthritis with small bilateral hip effusions. Conservative treatment. Weightbearing as tolerated. Tylenol for pain as needed. Hypokalemia: This was  repleted. Her potassium day of admission was at 3.2. On November 25 was at 4.2. Anemia: Iron deficiency. Continue with ferrous sulfate and B12 supplement. She had an anemia panel does show item at 35 , ferritin 203, folate 16 , B12 246 . She would need it repeated B12 level and anemia panel , hemoglobin .  UTI. Urine grew Klebsiella. It was sensitive to ciprofloxacin. Patient received a total of 5 days of antibiotic.   Disposition and Follow-up: With PCP, and orthopedic as needed. She will need a Bmet to follow up in sodium level.    DISCHARGE EXAM:  Patient appears well, alert and oriented x 3, pleasant, cooperative. Vitals are as noted. Neck supple and free of adenopathy, or masses. No thyromegaly. PERLA. Ears, throat are normal. Lungs are clear to auscultation. Heart sounds are normal, no murmurs, clicks, gallops or rubs. Abdomen is soft, no tenderness, masses or organomegaly.  Extremities : left knee swelling decreases.  Peripheral pulses are normal. Screening neurological exam is normal without focal findings. Skin is normal without suspicious lesions noted.   Blood pressure 133/65, pulse 83, temperature 98.8 F (37.1 C), temperature source Oral, resp. rate 18, height 5\' 3"  (1.6 m), weight 54.885 kg (121 lb), SpO2 94.00%.   Basename 09/10/11 1056  NA 133*  K 4.2  CL 98  CO2 25  GLUCOSE 186*  BUN 21  CREATININE 0.54  CALCIUM 9.2  MG --  PHOS --    Basename 09/10/11 1056  WBC 8.4  NEUTROABS --  HGB 11.7*  HCT 35.8*  MCV 99.4  PLT 261    Signed: Otillia Cordone M.D. 09/11/2011, 4:00 PM  Patient seen and examined November 27. No change in medical.  Day of discharge patient was in improved condition. She will be transferred to skilled nursing facility for rehabilitation.

## 2011-09-11 NOTE — Progress Notes (Signed)
Physical Therapy Cancel Note Patient refused to participate in physical therapy today.  Much encouragement and reassurance provided over 10 minute period with OT, PT and nurse tech present.  Patient demonstrating paranoid tendency and continued to refuse.  Will retry later as time permits and patient willing.  Thank you.  [place], Ackerman [phone]

## 2011-09-11 NOTE — Progress Notes (Signed)
CARE MANAGEMENT NOTE 09/11/2011  Patient:  Brittany [name], Brittany [name]   Account Number:  [id number]  Date Initiated:  09/11/2011  Documentation initiated by:  Jaya Lapka  Subjective/Objective Assessment:   pt fell in her home presneted with knee pain s/p fall and ams     Action/Plan:   patient may required st rehab and then assisted living placement   Anticipated DC Date:  09/14/2011   Anticipated DC Plan:  SKILLED NURSING FACILITY  In-house referral  Clinical Social Worker      DC Planning Services  NA      [facility] Choice  NA   Choice offered to / List presented to:  NA   DME arranged  NA      DME agency  NA     HH arranged  NA      HH agency  NA   Status of service:  In process, will continue to follow Medicare Important Message given?   (If response is "NO", the following Medicare IM given date fields will be blank) Date Medicare IM given:   Date Additional Medicare IM given:    Discharge Disposition:    Per UR Regulation:  Reviewed for med. necessity/level of care/duration of stay  Comments:  [id number] Earlene Plater, RN, BSN, CCM/CHART REVIEW FOR UR PERFORMED.

## 2011-09-11 NOTE — Progress Notes (Signed)
Patient sitting in bed, confused at baseline. Patient family agree with SNF for short term rehab. Will contact SW to help with placement.  Brittany [name] [phone]

## 2011-09-12 MED ORDER — CYANOCOBALAMIN 100 MCG PO TABS
100.0000 ug | ORAL_TABLET | Freq: Every day | ORAL | Status: DC
Start: 2011-09-12 — End: 2012-05-30

## 2011-09-12 MED ORDER — FERROUS SULFATE 325 (65 FE) MG PO TABS: 325.0000 mg | ORAL_TABLET | Freq: Two times a day (BID) | ORAL | Status: DC

## 2011-09-12 NOTE — Progress Notes (Signed)
OT Note:  Spoke to Charity fundraiser.  Pt is pending discharge to skilled facility today. If she does not discharge today, we'll check back tomorrow.   [place], OTR/L [phone] 09/12/2011

## 2011-09-12 NOTE — Progress Notes (Signed)
Patient discharged to SNF. Left via stretcher with ambulance. Left in good condition. Vw, rn.

## 2011-09-13 LAB — BODY FLUID CULTURE

## 2012-05-30 ENCOUNTER — Inpatient Hospital Stay (HOSPITAL_COMMUNITY)
Admission: EM | Admit: 2012-05-30 | Discharge: 2012-06-04 | DRG: 493 | Disposition: A | Discharge status: Skilled Nursing Facility | Payer: Medicare Other | Attending: Orthopedic Surgery | Admitting: Orthopedic Surgery

## 2012-05-30 ENCOUNTER — Encounter (HOSPITAL_COMMUNITY): Payer: Self-pay | Admitting: Emergency Medicine

## 2012-05-30 ENCOUNTER — Emergency Department (HOSPITAL_COMMUNITY): Payer: Medicare Other

## 2012-05-30 DIAGNOSIS — D62 Acute posthemorrhagic anemia: Secondary | ICD-10-CM | POA: Diagnosis not present

## 2012-05-30 DIAGNOSIS — M25462 Effusion, left knee: Secondary | ICD-10-CM | POA: Diagnosis present

## 2012-05-30 DIAGNOSIS — D649 Anemia, unspecified: Secondary | ICD-10-CM

## 2012-05-30 DIAGNOSIS — F0390 Unspecified dementia without behavioral disturbance: Secondary | ICD-10-CM | POA: Diagnosis present

## 2012-05-30 DIAGNOSIS — D509 Iron deficiency anemia, unspecified: Secondary | ICD-10-CM | POA: Diagnosis present

## 2012-05-30 DIAGNOSIS — G8918 Other acute postprocedural pain: Secondary | ICD-10-CM

## 2012-05-30 DIAGNOSIS — N390 Urinary tract infection, site not specified: Secondary | ICD-10-CM | POA: Diagnosis present

## 2012-05-30 DIAGNOSIS — R52 Pain, unspecified: Secondary | ICD-10-CM | POA: Diagnosis present

## 2012-05-30 DIAGNOSIS — E876 Hypokalemia: Secondary | ICD-10-CM | POA: Diagnosis present

## 2012-05-30 DIAGNOSIS — S82209A Unspecified fracture of shaft of unspecified tibia, initial encounter for closed fracture: Principal | ICD-10-CM | POA: Diagnosis present

## 2012-05-30 DIAGNOSIS — Y9210 Unspecified residential institution as the place of occurrence of the external cause: Secondary | ICD-10-CM | POA: Diagnosis present

## 2012-05-30 DIAGNOSIS — W010XXA Fall on same level from slipping, tripping and stumbling without subsequent striking against object, initial encounter: Secondary | ICD-10-CM | POA: Diagnosis present

## 2012-05-30 DIAGNOSIS — S82202A Unspecified fracture of shaft of left tibia, initial encounter for closed fracture: Secondary | ICD-10-CM | POA: Diagnosis present

## 2012-05-30 DIAGNOSIS — B9689 Other specified bacterial agents as the cause of diseases classified elsewhere: Secondary | ICD-10-CM | POA: Diagnosis present

## 2012-05-30 DIAGNOSIS — M25469 Effusion, unspecified knee: Secondary | ICD-10-CM | POA: Diagnosis present

## 2012-05-30 DIAGNOSIS — Z8673 Personal history of transient ischemic attack (TIA), and cerebral infarction without residual deficits: Secondary | ICD-10-CM

## 2012-05-30 DIAGNOSIS — S82409A Unspecified fracture of shaft of unspecified fibula, initial encounter for closed fracture: Principal | ICD-10-CM | POA: Diagnosis present

## 2012-05-30 DIAGNOSIS — I10 Essential (primary) hypertension: Secondary | ICD-10-CM | POA: Diagnosis present

## 2012-05-30 HISTORY — DX: Iron deficiency anemia, unspecified: D50.9

## 2012-05-30 HISTORY — DX: Unilateral primary osteoarthritis, unspecified knee: M17.10

## 2012-05-30 HISTORY — DX: Osteoarthritis of hip, unspecified: M16.9
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
## 2012-05-31 ENCOUNTER — Inpatient Hospital Stay (HOSPITAL_COMMUNITY): Payer: Medicare Other

## 2012-05-31 ENCOUNTER — Other Ambulatory Visit: Payer: Self-pay | Admitting: Orthopedic Surgery

## 2012-05-31 ENCOUNTER — Emergency Department (HOSPITAL_COMMUNITY): Payer: Medicare Other

## 2012-05-31 ENCOUNTER — Encounter (HOSPITAL_COMMUNITY)
Admission: EM | Disposition: A | Discharge status: Skilled Nursing Facility | Payer: Self-pay | Source: Home / Self Care | Attending: Orthopedic Surgery

## 2012-05-31 ENCOUNTER — Inpatient Hospital Stay (HOSPITAL_COMMUNITY): Payer: Medicare Other | Admitting: Anesthesiology

## 2012-05-31 ENCOUNTER — Encounter (HOSPITAL_COMMUNITY): Payer: Self-pay | Admitting: Internal Medicine

## 2012-05-31 ENCOUNTER — Encounter (HOSPITAL_COMMUNITY): Payer: Self-pay | Admitting: Anesthesiology

## 2012-05-31 DIAGNOSIS — F0390 Unspecified dementia without behavioral disturbance: Secondary | ICD-10-CM

## 2012-05-31 DIAGNOSIS — S82409A Unspecified fracture of shaft of unspecified fibula, initial encounter for closed fracture: Principal | ICD-10-CM

## 2012-05-31 DIAGNOSIS — M1710 Unilateral primary osteoarthritis, unspecified knee: Secondary | ICD-10-CM

## 2012-05-31 DIAGNOSIS — S82202A Unspecified fracture of shaft of left tibia, initial encounter for closed fracture: Secondary | ICD-10-CM | POA: Diagnosis present

## 2012-05-31 DIAGNOSIS — D509 Iron deficiency anemia, unspecified: Secondary | ICD-10-CM

## 2012-05-31 DIAGNOSIS — M179 Osteoarthritis of knee, unspecified: Secondary | ICD-10-CM

## 2012-05-31 DIAGNOSIS — N390 Urinary tract infection, site not specified: Secondary | ICD-10-CM | POA: Diagnosis present

## 2012-05-31 DIAGNOSIS — M169 Osteoarthritis of hip, unspecified: Secondary | ICD-10-CM

## 2012-05-31 HISTORY — DX: Osteoarthritis of hip, unspecified: M16.9

## 2012-05-31 HISTORY — PX: TIBIA IM NAIL INSERTION: SHX2516

## 2012-05-31 HISTORY — DX: Unilateral primary osteoarthritis, unspecified knee: M17.10

## 2012-05-31 HISTORY — DX: Osteoarthritis of knee, unspecified: M17.9

## 2012-05-31 LAB — URINALYSIS, ROUTINE W REFLEX MICROSCOPIC
Nitrite: POSITIVE — AB
Protein, ur: NEGATIVE mg/dL
Urobilinogen, UA: 0.2 mg/dL (ref 0.0–1.0)

## 2012-05-31 LAB — URINE MICROSCOPIC-ADD ON

## 2012-05-31 LAB — IRON AND TIBC
Saturation Ratios: 9 % — ABNORMAL LOW (ref 20–55)
UIBC: 214 ug/dL (ref 125–400)

## 2012-05-31 LAB — CBC
MCV: 95.5 fL (ref 78.0–100.0)
Platelets: 220 10*3/uL (ref 150–400)
RDW: 14.2 % (ref 11.5–15.5)
WBC: 9.7 10*3/uL (ref 4.0–10.5)

## 2012-05-31 LAB — POCT I-STAT, CHEM 8
Creatinine, Ser: 0.7 mg/dL (ref 0.50–1.10)
Hemoglobin: 9.9 g/dL — ABNORMAL LOW (ref 12.0–15.0)
Sodium: 140 mEq/L (ref 135–145)
TCO2: 25 mmol/L (ref 0–100)

## 2012-05-31 LAB — FERRITIN: Ferritin: 256 ng/mL (ref 10–291)

## 2012-05-31 LAB — APTT: aPTT: 36 seconds (ref 24–37)

## 2012-05-31 LAB — PROTIME-INR: Prothrombin Time: 14.4 seconds (ref 11.6–15.2)

## 2012-05-31 LAB — FOLATE: Folate: 16.7 ng/mL

## 2012-05-31 SURGERY — INSERTION, INTRAMEDULLARY ROD, TIBIA
Anesthesia: General | Site: Leg Lower | Laterality: Left | Wound class: Clean

## 2012-05-31 MED ORDER — ONDANSETRON HCL 4 MG/2ML IJ SOLN
4.0000 mg | Freq: Once | INTRAMUSCULAR | Status: AC
Start: 2012-05-31 — End: 2012-05-31
  Administered 2012-05-31: 4 mg via INTRAVENOUS

## 2012-05-31 MED ORDER — VANCOMYCIN HCL IN DEXTROSE 1-5 GM/200ML-% IV SOLN
1000.0000 mg | INTRAVENOUS | Status: AC
  Administered 2012-05-31: 1000 mg via INTRAVENOUS
  Filled 2012-05-31: qty 200

## 2012-05-31 MED ORDER — LACTATED RINGERS IV SOLN
INTRAVENOUS | Status: DC | PRN
  Administered 2012-05-31 (×2): via INTRAVENOUS

## 2012-05-31 MED ORDER — FENTANYL CITRATE 0.05 MG/ML IJ SOLN
INTRAMUSCULAR | Status: AC
  Administered 2012-05-31: 100 ug via INTRAVENOUS
  Filled 2012-05-31: qty 2

## 2012-05-31 MED ORDER — FENTANYL CITRATE 0.05 MG/ML IJ SOLN
50.0000 ug | Freq: Once | INTRAMUSCULAR | Status: AC
  Administered 2012-05-31: 100 ug via INTRAVENOUS

## 2012-05-31 MED ORDER — FENTANYL CITRATE 0.05 MG/ML IJ SOLN
50.0000 ug | Freq: Once | INTRAMUSCULAR | Status: AC
  Administered 2012-05-31: 50 ug via INTRAVENOUS
  Filled 2012-05-31: qty 2

## 2012-05-31 MED ORDER — FENTANYL CITRATE 0.05 MG/ML IJ SOLN: 25.0000 ug | INTRAMUSCULAR | Status: DC | PRN

## 2012-05-31 MED ORDER — BUPIVACAINE HCL 0.25 % IJ SOLN
INTRAMUSCULAR | Status: DC | PRN
  Administered 2012-05-31: 19 mL

## 2012-05-31 MED ORDER — EPHEDRINE SULFATE 50 MG/ML IJ SOLN
INTRAMUSCULAR | Status: DC | PRN
  Administered 2012-05-31: 5 mg via INTRAVENOUS

## 2012-05-31 MED ORDER — FENTANYL CITRATE 0.05 MG/ML IJ SOLN
INTRAMUSCULAR | Status: DC | PRN
  Administered 2012-05-31: 50 ug via INTRAVENOUS
  Administered 2012-05-31: 75 ug via INTRAVENOUS
  Administered 2012-05-31: 50 ug via INTRAVENOUS
  Administered 2012-05-31: 25 ug via INTRAVENOUS

## 2012-05-31 MED ORDER — SUCCINYLCHOLINE CHLORIDE 20 MG/ML IJ SOLN
INTRAMUSCULAR | Status: DC | PRN
  Administered 2012-05-31: 80 mg via INTRAVENOUS

## 2012-05-31 MED ORDER — ACETAMINOPHEN 10 MG/ML IV SOLN
INTRAVENOUS | Status: DC | PRN
  Administered 2012-05-31: 1000 mg via INTRAVENOUS

## 2012-05-31 MED ORDER — PROPOFOL 10 MG/ML IV EMUL
INTRAVENOUS | Status: DC | PRN
  Administered 2012-05-31: 70 mg via INTRAVENOUS

## 2012-05-31 MED ORDER — ONDANSETRON HCL 4 MG/2ML IJ SOLN: 4.0000 mg | Freq: Four times a day (QID) | INTRAMUSCULAR | Status: DC | PRN

## 2012-05-31 MED ORDER — LIDOCAINE HCL (CARDIAC) 20 MG/ML IV SOLN
INTRAVENOUS | Status: DC | PRN
  Administered 2012-05-31: 75 mg via INTRAVENOUS

## 2012-05-31 MED ORDER — ONDANSETRON HCL 4 MG PO TABS: 4.0000 mg | ORAL_TABLET | Freq: Four times a day (QID) | ORAL | Status: DC | PRN

## 2012-05-31 MED ORDER — CHLORHEXIDINE GLUCONATE 4 % EX LIQD
60.0000 mL | Freq: Once | CUTANEOUS | Status: AC
  Administered 2012-05-31: 4 via TOPICAL
  Filled 2012-05-31: qty 60

## 2012-05-31 MED ORDER — CIPROFLOXACIN IN D5W 400 MG/200ML IV SOLN
400.0000 mg | Freq: Once | INTRAVENOUS | Status: AC
  Administered 2012-05-31: 400 mg via INTRAVENOUS
  Filled 2012-05-31: qty 200

## 2012-05-31 MED ORDER — SODIUM CHLORIDE 0.9 % IV SOLN
INTRAVENOUS | Status: DC
  Administered 2012-05-31: 03:00:00 via INTRAVENOUS

## 2012-05-31 MED ORDER — MORPHINE SULFATE 2 MG/ML IJ SOLN
2.0000 mg | INTRAMUSCULAR | Status: DC | PRN
  Administered 2012-05-31 (×3): 2 mg via INTRAVENOUS
  Filled 2012-05-31 (×4): qty 1

## 2012-05-31 MED ORDER — SODIUM CHLORIDE 0.9 % IV SOLN
INTRAVENOUS | Status: DC
  Administered 2012-05-31 – 2012-06-01 (×2): via INTRAVENOUS
  Administered 2012-06-01: 1000 mL via INTRAVENOUS
  Administered 2012-06-01: 22:00:00 via INTRAVENOUS

## 2012-05-31 MED ORDER — DEXTROSE 5 % IV SOLN
1.0000 g | INTRAVENOUS | Status: DC
  Filled 2012-05-31: qty 10

## 2012-05-31 MED ORDER — ACETAMINOPHEN 325 MG PO TABS: 650.0000 mg | ORAL_TABLET | ORAL | Status: DC | PRN

## 2012-05-31 MED ORDER — PROMETHAZINE HCL 25 MG/ML IJ SOLN: 6.2500 mg | INTRAMUSCULAR | Status: DC | PRN

## 2012-05-31 MED ORDER — HYDROXYZINE HCL 10 MG PO TABS
10.0000 mg | ORAL_TABLET | Freq: Four times a day (QID) | ORAL | Status: DC | PRN
  Filled 2012-05-31: qty 1

## 2012-05-31 MED ORDER — ONDANSETRON HCL 4 MG/2ML IJ SOLN
INTRAMUSCULAR | Status: AC
  Administered 2012-05-31: 4 mg
  Filled 2012-05-31: qty 2

## 2012-05-31 SURGICAL SUPPLY — 41 items
BAG ZIPLOCK 12X15 (MISCELLANEOUS) ×2 IMPLANT
BANDAGE ELASTIC 3 VELCRO ST LF (GAUZE/BANDAGES/DRESSINGS) ×2 IMPLANT
BANDAGE ELASTIC 6 VELCRO ST LF (GAUZE/BANDAGES/DRESSINGS) ×2 IMPLANT
BANDAGE GAUZE ELAST BULKY 4 IN (GAUZE/BANDAGES/DRESSINGS) ×2 IMPLANT
BIT DRILL 3.8X6 NS (BIT) ×2 IMPLANT
BIT DRILL 4.4 NS (BIT) ×2 IMPLANT
BNDG COHESIVE 4X5 TAN STRL (GAUZE/BANDAGES/DRESSINGS) ×2 IMPLANT
CLOTH BEACON ORANGE TIMEOUT ST (SAFETY) ×2 IMPLANT
DRAPE C-ARM 42X72 X-RAY (DRAPES) ×2 IMPLANT
DRAPE C-ARMOR (DRAPES) ×2 IMPLANT
DRAPE INCISE IOBAN 66X45 STRL (DRAPES) ×2 IMPLANT
DRAPE U-SHAPE 47X51 STRL (DRAPES) ×2 IMPLANT
DRSG ADAPTIC 3X8 NADH LF (GAUZE/BANDAGES/DRESSINGS) ×2 IMPLANT
DRSG PAD ABDOMINAL 8X10 ST (GAUZE/BANDAGES/DRESSINGS) ×2 IMPLANT
DURAPREP 26ML APPLICATOR (WOUND CARE) ×2 IMPLANT
ELECT REM PT RETURN 9FT ADLT (ELECTROSURGICAL) ×2
ELECTRODE REM PT RTRN 9FT ADLT (ELECTROSURGICAL) ×1 IMPLANT
FACESHIELD LNG OPTICON STERILE (SAFETY) ×6 IMPLANT
GLOVE SURG ORTHO 9.0 STRL STRW (GLOVE) ×2 IMPLANT
GUIDEWIRE BALL NOSE 80CM (WIRE) ×2 IMPLANT
IMMOBILIZER KNEE 20 (SOFTGOODS) ×2
IMMOBILIZER KNEE 20 THIGH 36 (SOFTGOODS) ×1 IMPLANT
KIT BASIN OR (CUSTOM PROCEDURE TRAY) ×2 IMPLANT
MANIFOLD NEPTUNE II (INSTRUMENTS) ×2 IMPLANT
NAIL TIBIAL 11MMX33CM (Nail) ×2 IMPLANT
NS IRRIG 1000ML POUR BTL (IV SOLUTION) ×2 IMPLANT
PACK LOWER EXTREMITY WL (CUSTOM PROCEDURE TRAY) ×2 IMPLANT
PIN GUIDE ACE (PIN) ×2 IMPLANT
POSITIONER SURGICAL ARM (MISCELLANEOUS) ×2 IMPLANT
SCREW ACECAP 38MM (Screw) ×2 IMPLANT
SCREW PROXIMAL DEPUY (Screw) ×2 IMPLANT
SCREW PRXML FT 40X5.5XNS LF (Screw) ×1 IMPLANT
SCREW PRXML FT 55X5.5XNS TIB (Screw) ×1 IMPLANT
SPONGE GAUZE 4X4 12PLY (GAUZE/BANDAGES/DRESSINGS) ×2 IMPLANT
SPONGE LAP 4X18 X RAY DECT (DISPOSABLE) ×2 IMPLANT
STAPLER VISISTAT 35W (STAPLE) ×2 IMPLANT
STOCKINETTE 8 INCH (MISCELLANEOUS) ×2 IMPLANT
SUT VIC AB 0 CT1 27 (SUTURE) ×1
SUT VIC AB 0 CT1 27XBRD ANTBC (SUTURE) ×1 IMPLANT
SUT VIC AB 2-0 CT1 36 (SUTURE) ×2 IMPLANT
WATER STERILE IRR 1500ML POUR (IV SOLUTION) ×2 IMPLANT

## 2012-05-31 NOTE — Consult Note (Signed)
[facility] Medical Consultation  Brittany [name] JYN:[id number] DOB: [birth year] DOA: 05/30/2012 PCP: No primary provider on file.   Requesting physician: Dr. Montez Morita Date of consultation: 05/31/2012 Reason for consultation: Preoperative clearance  Impression/Recommendations Principal Problem:  *Fracture of tibia with fibula, left, closed Active Problems:  Knee effusion, left  Generalized pain  Dementia  Iron deficiency anemia  UTI (lower urinary tract infection)  #1 preoperative clearance Patient does not have any significant cardiac history except for history of mitral valve prolapse. Patient has not had any chest pain or shortness of breath. Patient does have a history of mini strokes per daughter that was discovered incidentally on head CT. Deep to patient's age prior history of CVAs patient is likely a moderate risk for surgery however patient should be okay to undergo surgery per orthopedics. EKG has been ordered and shows a normal sinus rhythm. No need for any further cardiac workup. Monitor.  #2 urinary tract infection Will check urine cultures. We'll place on IV Rocephin.  #3 dementia Continue home regimen of Namenda, risperdal, Exelon patch, Zoloft.  #4 history of iron deficiency anemia  follow H&H. Continue home regimen of iron sulfate.  #5 spiral fracture of the left tibia and fibula Patient for surgery later on this afternoon. Per primary team.  #6 prophylaxis Will defer to primary team.    I will followup again tomorrow. Please contact me if I can be of assistance in the meanwhile. Thank you for this consultation.  Chief Complaint: Left leg pain  HPI:  Brittany [name] is a 78 year old Caucasian female nursing home resident mainly wheelchair bound since November of 2012 with a history of dementia, mitral valve prolapse, iron deficiency anemia, prior history of mini strokes discovered incidentally on CT scan per patient's daughter, history of panic attacks,  history of osteoarthritis who presented to the ED with left lower extremity pain. Patient is demented and a such was unable to obtain a history. History was obtained from the chart and per patient's daughter. Patient's daughter states that she was called from the nursing home that 2 CNA's were placing her mother in bed and the mother subsequently screamed and said her leg was broken. Patient was subsequently transferred to the ED.plain films of the left tibia and fibula show a spiral fractures of the shaft of the tibia and fibula. Patient's daughter states that patient does not have a history of coronary artery disease, patient has not had any history of an acute MI, patient has not had any fevers, no chills, no nausea, no vomiting, no abdominal pain, no dysuria, no weakness,. Patient's daughter does endorse a mother has a cough. No other associated symptoms. Patient was admitted to the orthopedic service and will call for preoperative clearance.   Review of Systems:  10 point review of systems done however patient states no full E. to assess secondary to her dementia. As per history of present illness otherwise negative.  Past Medical History  Diagnosis Date  . Murmur, cardiac   . Mitral valve prolapse   . Arthritis   . DEMENTIA   . Stroke     hx 2 mini strokes  . Iron deficiency anemia   . OA (osteoarthritis) of hip 05/31/2012  . OA (osteoarthritis) of knee 05/31/2012   Past Surgical History  Procedure Date  . Knee arthroscopy     which knee unknown  . Back surgery   . Appendectomy     years ago   Social History:  reports that she has  never smoked. She has never used smokeless tobacco. She reports that she does not drink alcohol or use illicit drugs.  Allergies  Allergen Reactions  . Penicillins Hives   History reviewed. No pertinent family history.  Prior to Admission medications   Medication Sig Start Date End Date Taking? Authorizing Provider  acetaminophen (TYLENOL) 325 MG  tablet Take 650 mg by mouth daily.   Yes Historical Provider, MD  acetaminophen (TYLENOL) 500 MG tablet Take 500 mg by mouth every 6 (six) hours as needed. pain    Yes Historical Provider, MD  cephALEXin (KEFLEX) 250 MG capsule Take 250 mg by mouth at bedtime. For prophylaxis.   Yes Historical Provider, MD  cholecalciferol (VITAMIN D) 1000 UNITS tablet Take 2,000 Units by mouth daily.   Yes Historical Provider, MD  ferrous sulfate 325 (65 FE) MG tablet Take 325 mg by mouth 2 (two) times daily.   Yes Historical Provider, MD  haloperidol lactate (HALDOL) 5 MG/ML injection Inject 2 mg into the muscle every 6 (six) hours as needed. For agitation   Yes Historical Provider, MD  memantine (NAMENDA) 10 MG tablet Take 10 mg by mouth 2 (two) times daily.   Yes Historical Provider, MD  risperiDONE (RISPERDAL) 0.5 MG tablet Take 0.5 mg by mouth at bedtime.   Yes Historical Provider, MD  risperiDONE (RISPERDAL) 0.5 MG tablet Take 0.5 mg by mouth 3 (three) times daily as needed.   Yes Historical Provider, MD  rivastigmine (EXELON) 9.5 mg/24hr Place 1 patch onto the skin daily. Remove old patch before placing new patch.   Yes Historical Provider, MD  sertraline (ZOLOFT) 25 MG tablet Take 75 mg by mouth daily.   Yes Historical Provider, MD  vitamin B-12 (CYANOCOBALAMIN) 100 MCG tablet Take 100 mcg by mouth daily.   Yes Historical Provider, MD   Physical Exam: Blood pressure 143/68, pulse 77, temperature 98.5 F (36.9 C), temperature source Oral, resp. rate 26, height 5\' 4"  (1.626 m), weight 56.7 kg (125 lb), SpO2 93.00%. Filed Vitals:   05/30/12 2309 05/31/12 0241 05/31/12 0623  BP: 144/67 156/64 143/68  Pulse: 69 78 77  Temp: 97.8 F (36.6 C)  98.5 F (36.9 C)  TempSrc: Oral    Resp: 20 26 26   Height:   5\' 4"  (1.626 m)  Weight:   56.7 kg (125 lb)  SpO2: 94% 90% 93%     General:  Well-developed well-nourished in no acute cardiopulmonary distress. Pleasantly confused.  Eyes: Pupils equal round and  reactive to light and accommodation. Extraocular movements intact.   ENT: Oropharynx is clear, no lesions, no exudates  Neck: Supple with no lymphadenopathy  Cardiovascular: Regular rate rhythm no murmurs rubs or gallops. Left lower extremity with some swelling with cast on right lower extremity without edema. No JVD.  Respiratory: Clear to auscultation bilaterally in anterior lung fields  Abdomen: Soft, nontender, nondistended, positive bowel sounds  Skin: Some areas of ecchymosis otherwise no rashes or lesions.  Musculoskeletal: 5 out of 5 bilateral upper extremity strength. 5 out of 5 right lower extremity strength.  Psychiatric: Flat mood. Normal affect. Demented.  Neurologic: Alert self. Radiodense 2 through 12 are grossly intact. Patient moving extremities spontaneously. Sensation is intact.  Labs on Admission:  Basic Metabolic Panel:  Lab 05/31/12 1191  NA 140  K 4.1  CL 105  CO2 --  GLUCOSE 106*  BUN 22  CREATININE 0.70  CALCIUM --  MG --  PHOS --   Liver Function Tests: No results found  for this basename: AST:5,ALT:5,ALKPHOS:5,BILITOT:5,PROT:5,ALBUMIN:5 in the last 168 hours No results found for this basename: LIPASE:5,AMYLASE:5 in the last 168 hours No results found for this basename: AMMONIA:5 in the last 168 hours CBC:  Lab 05/31/12 0023 05/30/12 2359  WBC -- 9.7  NEUTROABS -- --  HGB 9.9* 10.1*  HCT 29.0* 31.5*  MCV -- 95.5  PLT -- 220   Cardiac Enzymes: No results found for this basename: CKTOTAL:5,CKMB:5,CKMBINDEX:5,TROPONINI:5 in the last 168 hours BNP: No components found with this basename: POCBNP:5 CBG: No results found for this basename: GLUCAP:5 in the last 168 hours  Radiological Exams on Admission: Dg Chest 1 View  05/31/2012  *RADIOLOGY REPORT*  Clinical Data: Leg injury.  CHEST - 1 VIEW  Comparison: 09/06/2011.  Findings: The patient is rotated to the left.  Pulmonary vascular congestion.  Cardiopericardial silhouette appears within  normal limits.  Aortic atherosclerosis. There is no airspace disease identified.  Left basilar atelectasis. Healed right posterior rib fracture.  Osteopenia.  Healed proximal left humerus fracture.  IMPRESSION: No acute cardiopulmonary disease.  Chronic changes of the chest. Healed rib and left proximal humerus fractures.  Original Report Authenticated By: Andreas Newport, M.D.   Dg Hip Complete Left  05/31/2012  *RADIOLOGY REPORT*  Clinical Data: Leg pain.  LEFT HIP - COMPLETE 2+ VIEW  Comparison: None.  Findings: Severe osteopenia is present.  The obturator rings grossly appear intact.  Both hips are externally rotated.  No grossly displaced fracture of the femoral necks.  Consider obtaining repeat examination with external rotation of the hips if there is concern for hip fracture.  On the dedicated views of the left hip, there is no fracture plane identified and the trabecular markings appear within normal limits.  Extensive atherosclerotic calcification.  Technically poor the lateral view.  The hip grossly appears located. If there is a high clinical suspicion of hip fracture (patient refuses to bear weight), MRI is preferred in cases of radiographically occult hip fracture, particularly in osteopenic patients.  While CT is expeditious, evidence is lacking regarding accuracy over radiography.  IMPRESSION: No grossly displaced fracture.  Marked osteopenia.  Original Report Authenticated By: Andreas Newport, M.D.   Dg Tibia/fibula Left  05/31/2012  *RADIOLOGY REPORT*  Clinical Data: Leg pain.  LEFT TIBIA AND FIBULA - 2 VIEW  Comparison: None.  Findings: Spiral fracture of the mid shaft of the left tibia is present.  There is one shaft lateral displacement of the tibia with apex dorsal angulation.  The proximal fibular diaphysis also shows a spiral fracture with one shaft lateral displacement.  There is apex dorsal angulation of the fibular fracture with one shaft width anterior displacement of the distal  fibula.  Visualized hind foot appears within normal limits with a K-wire fixation in the metatarsals. Partial visualization of the left knee demonstrate severe osteoarthritis.  IMPRESSION: Spiral fractures of the shaft of the tibia and fibula.  Diffuse osteopenia.  Original Report Authenticated By: Andreas Newport, M.D.    EKG: Not done  Time spent: 60 mins  [facility] Pager [phone]  If 7PM-7AM, please contact night-coverage [email or web address] Password [email or web address] 05/31/2012, 11:59 AM

## 2012-05-31 NOTE — Progress Notes (Signed)
Orthopedic Tech Progress Note Patient Details:  Brittany [name] 30-Dec-1923 [id number] Short leg post. Splint with extra padding applied to Left LE with nursing assistance, daughter present Ortho Devices Type of Ortho Device: Post (short) splint Splint Material: Plaster Ortho Device/Splint Location: Post splint with extra padding applied to Left LE as requested Ortho Device/Splint Interventions: Application   Asia R Thompson 05/31/2012, 5:53 AM

## 2012-05-31 NOTE — Anesthesia Postprocedure Evaluation (Signed)
  Anesthesia Post-op Note  Patient: Brittany [name]  Procedure(s) Performed: Procedure(s) (LRB): INTRAMEDULLARY (IM) NAIL TIBIAL (Left)  Patient Location: PACU  Anesthesia Type: General  Level of Consciousness: sedated  Airway and Oxygen Therapy: Patient Spontanous Breathing  Post-op Pain: mild  Post-op Assessment: Post-op Vital signs reviewed, Patient's Cardiovascular Status Stable, Respiratory Function Stable, Patent Airway and No signs of Nausea or vomiting  Post-op Vital Signs: stable  Complications: No apparent anesthesia complications

## 2012-05-31 NOTE — Preoperative (Signed)
Beta Blockers   Reason not to administer Beta Blockers:Not Applicable 

## 2012-05-31 NOTE — Transfer of Care (Signed)
Immediate Anesthesia Transfer of Care Note  Patient: Brittany [name]  Procedure(s) Performed: Procedure(s) (LRB): INTRAMEDULLARY (IM) NAIL TIBIAL (Left)  Patient Location: PACU  Anesthesia Type: General  Level of Consciousness: confused  Airway & Oxygen Therapy: Patient Spontanous Breathing and Patient connected to face mask oxygen  Post-op Assessment: Report given to PACU RN and Post -op Vital signs reviewed and stable  Post vital signs: Reviewed and stable  Complications: No apparent anesthesia complications and Infiltrated IV.  Arm elevated and warm moist compresses applied

## 2012-05-31 NOTE — Progress Notes (Signed)
[facility] ED CM and periop Cm consulted about snf pt with fx Referred to unit SW

## 2012-05-31 NOTE — Brief Op Note (Signed)
05/30/2012 - 05/31/2012  10:11 PM  PATIENT:  Brittany [name]  78 y.o. female  PRE-OPERATIVE DIAGNOSIS:  fracture left tibia and fibula left leg  POST-OPERATIVE DIAGNOSIS:  fracture left tibia and fibula left leg  PROCEDURE:  Procedure(s) (LRB): INTRAMEDULLARY (IM) NAIL TIBIAL (Left)  SURGEON:  Surgeon(s) and Role:    * Kennieth Rad, MD - Primary  PHYSICIAN ASSISTANT:   ASSISTANTS: none   ANESTHESIA:   general  EBL:  Total I/O In: 1000 [I.V.:1000] Out: -   BLOOD ADMINISTERED:none  DRAINS: none   LOCAL MEDICATIONS USED:  MARCAINE     SPECIMEN:  No Specimen  DISPOSITION OF SPECIMEN:  N/A  COUNTS:  YES  TOURNIQUET:   Total Tourniquet Time Documented: Thigh (Left) - 72 minutes  DICTATION: .Other Dictation: Dictation Number REOORT# [id number]  PLAN OF CARE: Admit to inpatient   PATIENT DISPOSITION:  PACU - hemodynamically stable.   Delay start of Pharmacological VTE agent (>24hrs) due to surgical blood loss or risk of bleeding: no

## 2012-05-31 NOTE — Progress Notes (Signed)
Clinical Social Work Department CLINICAL SOCIAL WORK PLACEMENT NOTE 05/31/2012  Patient:  Brittany [name], Brittany [name]  Account Number:  [id number] Admit date:  05/30/2012  Clinical Social Worker:  Cori Razor, LCSW  Date/time:     Clinical Social Work is seeking post-discharge placement for this patient at the following level of care:      (*CSW will update this form in Epic as items are completed)   05/31/2012  Patient/family provided with [facility] Department of Clinical Social Work's list of facilities offering this level of care within the geographic area requested by the patient (or if unable, by the patient's family).  05/31/2012  Patient/family informed of their freedom to choose among providers that offer the needed level of care, that participate in Medicare, Medicaid or managed care program needed by the patient, have an available bed and are willing to accept the patient.    Patient/family informed of [facility]' ownership interest in [facility], as well as of the fact that they are under no obligation to receive care at this facility.  PASARR submitted to [employer] on  PASARR number received from [employer] on 09/11/2011  FL2 transmitted to all facilities in geographic area requested by pt/family on  05/31/2012 FL2 transmitted to all facilities within larger geographic area on   Patient informed that his/her managed care company has contracts with or will negotiate with  certain facilities, including the following:     Patient/family informed of bed offers received:   Patient chooses bed at  Physician recommends and patient chooses bed at    Patient to be transferred to  on   Patient to be transferred to facility by   The following physician request were entered in Epic:   Additional Comments:  Cori Razor LCSW [phone]

## 2012-05-31 NOTE — ED Notes (Signed)
Patient reports that she is itching to her left arm. Some redness noted to arm. No welts or rash noted at this time. The patient denies any SOB or trouble swallowing

## 2012-05-31 NOTE — Progress Notes (Signed)
INITIAL ADULT NUTRITION ASSESSMENT Date: 05/31/2012   Time: 10:34 AM Reason for Assessment: Nutrition Risk-problems chewing/swallowing  ASSESSMENT: Female 78 y.o.  Dx: fx of the left tibia and fibula  Hx:  Past Medical History  Diagnosis Date  . Murmur, cardiac   . Mitral valve prolapse   . Arthritis   . DEMENTIA   . Stroke     hx 2 mini strokes   Past Surgical History  Procedure Date  . Knee arthroscopy     which knee unknown  . Back surgery   . Appendectomy     years ago    Related Meds:     . ciprofloxacin  400 mg Intravenous Once  . fentaNYL  50 mcg Intravenous Once  . fentaNYL  50 mcg Intravenous Once  . ondansetron      . ondansetron (ZOFRAN) IV  4 mg Intravenous Once     Ht: 5\' 4"  (162.6 cm)  Wt: 125 lb (56.7 kg)  Ideal Wt: 54.7 kg  % Ideal Wt: 104  Usual Wt:  Wt Readings from Last 10 Encounters:  05/31/12 125 lb (56.7 kg)  05/31/12 125 lb (56.7 kg)  09/09/11 121 lb (54.885 kg)    % Usual Wt: 103  Body mass index is 21.46 kg/(m^2).  Labs:  CMP     Component Value Date/Time   NA 140 05/31/2012 0023   K 4.1 05/31/2012 0023   CL 105 05/31/2012 0023   CO2 25 09/10/2011 1056   GLUCOSE 106* 05/31/2012 0023   BUN 22 05/31/2012 0023   CREATININE 0.70 05/31/2012 0023   CALCIUM 9.2 09/10/2011 1056   PROT 5.3* 09/07/2011 0330   ALBUMIN 2.6* 09/07/2011 0330   AST 52* 09/07/2011 0330   ALT 38* 09/07/2011 0330   ALKPHOS 60 09/07/2011 0330   BILITOT 0.4 09/07/2011 0330   GFRNONAA 84* 09/10/2011 1056   GFRAA >90 09/10/2011 1056         Diet Order: NPO for surgery (ORIF)  Supplements/Tube Feeding:  none  IVF:    sodium chloride Last Rate: Stopped (05/31/12 0603)  sodium chloride Last Rate: 125 mL/hr at 05/31/12 0603    Estimated Nutritional Needs:   Kcal: 1400-1550 Protein: 80-90 gm Fluid: >1.3L Food/Nutrition Related Hx: Pt from a NH.  Spoke with pt and sitter from NH.  Pt usually with good intake prior to admit.  Soft diet secondary to  difficulty chewing.  Loves ice cream.  Currently NPO for surgery today.  Protein needs high for healing.  Very thin arms, dry, fragile skin.  NUTRITION DIAGNOSIS: -Inadequate oral intake (NI-2.1).  Status: Ongoing  RELATED TO: inability to eat  AS EVIDENCE BY: npo status  MONITORING/EVALUATION(Goals): Goal:  Diet advancement and tolerate to meet estimated needs. Monitor:  Diet advancement and intake, labs  EDUCATION NEEDS: -No education needs identified at this time  INTERVENTION: Rec:  Mechanical Soft diet when diet advanced. Add supplements when advanced.  Dietitian [phone]   DOCUMENTATION CODES Per approved criteria  -Not Applicable    Jeoffrey Massed 05/31/2012, 10:34 AM

## 2012-05-31 NOTE — ED Notes (Signed)
Patient to the floor. MD opitz viewed splint. No changes needed. Patient is more sleepy and tolerated splint well.

## 2012-05-31 NOTE — Anesthesia Preprocedure Evaluation (Signed)
Anesthesia Evaluation  Patient identified by MRN, date of birth, ID band Patient awake    Reviewed: Allergy & Precautions, H&P , NPO status , Patient's Chart, lab work & pertinent test results  Airway Mallampati: II TM Distance: >3 FB Neck ROM: Limited    Dental No notable dental hx.    Pulmonary neg pulmonary ROS,  breath sounds clear to auscultation  Pulmonary exam normal       Cardiovascular negative cardio ROS  Rhythm:Regular Rate:Normal     Neuro/Psych dementia CVA negative psych ROS   GI/Hepatic negative GI ROS, Neg liver ROS,   Endo/Other  negative endocrine ROS  Renal/GU negative Renal ROS  negative genitourinary   Musculoskeletal negative musculoskeletal ROS (+)   Abdominal   Peds negative pediatric ROS (+)  Hematology  (+) Blood dyscrasia, anemia ,   Anesthesia Other Findings   Reproductive/Obstetrics negative OB ROS                           Anesthesia Physical Anesthesia Plan  ASA: III  Anesthesia Plan: General   Post-op Pain Management:    Induction: Intravenous  Airway Management Planned: Oral ETT  Additional Equipment:   Intra-op Plan:   Post-operative Plan: Extubation in OR  Informed Consent:   Dental advisory given  Plan Discussed with: CRNA and Surgeon  Anesthesia Plan Comments:         Anesthesia Quick Evaluation

## 2012-05-31 NOTE — Plan of Care (Signed)
Problem: Diagnosis - Type of Surgery Goal: General Surgical Patient Education (See Patient Education module for education specifics)  Outcome: Progressing Pt. Has dementia ,instructed dtr. On what to expect for ORIF LT. TIBIA

## 2012-05-31 NOTE — Anesthesia Procedure Notes (Signed)
Procedure Name: Intubation Date/Time: 05/31/2012 8:30 PM Performed by: Leroy Libman L Patient Re-evaluated:Patient Re-evaluated prior to inductionOxygen Delivery Method: Circle system utilized Preoxygenation: Pre-oxygenation with 100% oxygen Intubation Type: IV induction Ventilation: Mask ventilation without difficulty and Oral airway inserted - appropriate to patient size Laryngoscope Size: Miller and 3 Grade View: Grade I Tube type: Oral Tube size: 7.0 mm Number of attempts: 1 Airway Equipment and Method: Stylet Placement Confirmation: ETT inserted through vocal cords under direct vision,  breath sounds checked- equal and bilateral and positive ETCO2 Secured at: 20 cm Tube secured with: Tape Dental Injury: Teeth and Oropharynx as per pre-operative assessment

## 2012-05-31 NOTE — H&P (Signed)
Brittany [name] is an 78 y.o. female.   Chief Complaint: painful left leg HPI: this is a 78y/o female with dementia was  found to have fracture of the left tibia and fibula last night Called patient. Advised patient of provider's approval for requested procedure, as well as any comments/instructions from provider.   Provided patient w/ verbal instructions concerning pre-, intra- and post-procedure preparation and instructions.  Patient verbalized understanding of the above.   Patient has also been mailed a letter containing the following instructions :[facility]   resides at nursing home facility.   Past Medical History  Diagnosis Date  . Murmur, cardiac   . Mitral valve prolapse   . Arthritis   . DEMENTIA   . Stroke     hx 2 mini strokes    Past Surgical History  Procedure Date  . Knee arthroscopy     which knee unknown  . Back surgery   . Appendectomy     years ago    History reviewed. No pertinent family history. Social History:  reports that she has never smoked. She has never used smokeless tobacco. She reports that she does not drink alcohol or use illicit drugs.  Allergies:  Allergies  Allergen Reactions  . Penicillins Hives    Medications Prior to Admission  Medication Sig Dispense Refill  . acetaminophen (TYLENOL) 325 MG tablet Take 650 mg by mouth daily.      Marland Kitchen acetaminophen (TYLENOL) 500 MG tablet Take 500 mg by mouth every 6 (six) hours as needed. pain       . cephALEXin (KEFLEX) 250 MG capsule Take 250 mg by mouth at bedtime. For prophylaxis.      . cholecalciferol (VITAMIN D) 1000 UNITS tablet Take 2,000 Units by mouth daily.      . ferrous sulfate 325 (65 FE) MG tablet Take 325 mg by mouth 2 (two) times daily.      . haloperidol lactate (HALDOL) 5 MG/ML injection Inject 2 mg into the muscle every 6 (six) hours as needed. For agitation      . memantine (NAMENDA) 10 MG tablet Take 10 mg by mouth 2 (two) times daily.      . risperiDONE (RISPERDAL)  0.5 MG tablet Take 0.5 mg by mouth at bedtime.      . risperiDONE (RISPERDAL) 0.5 MG tablet Take 0.5 mg by mouth 3 (three) times daily as needed.      . rivastigmine (EXELON) 9.5 mg/24hr Place 1 patch onto the skin daily. Remove old patch before placing new patch.      . sertraline (ZOLOFT) 25 MG tablet Take 75 mg by mouth daily.      . vitamin B-12 (CYANOCOBALAMIN) 100 MCG tablet Take 100 mcg by mouth daily.        Results for orders placed during the hospital encounter of 05/30/12 (from the past 48 hour(s))  CBC     Status: Abnormal   Collection Time   05/30/12 11:59 PM      Component Value Range Comment   WBC 9.7  4.0 - 10.5 K/uL    RBC 3.30 (*) 3.87 - 5.11 MIL/uL    Hemoglobin 10.1 (*) 12.0 - 15.0 g/dL    HCT 19.1 (*) 47.8 - 46.0 %    MCV 95.5  78.0 - 100.0 fL    MCH 30.6  26.0 - 34.0 pg    MCHC 32.1  30.0 - 36.0 g/dL    RDW 29.5  62.1 - 30.8 %  Platelets 220  150 - 400 K/uL   POCT I-STAT, CHEM 8     Status: Abnormal   Collection Time   05/31/12 12:23 AM      Component Value Range Comment   Sodium 140  135 - 145 mEq/L    Potassium 4.1  3.5 - 5.1 mEq/L    Chloride 105  96 - 112 mEq/L    BUN 22  6 - 23 mg/dL    Creatinine, Ser 4.09  0.50 - 1.10 mg/dL    Glucose, Bld 811 (*) 70 - 99 mg/dL    Calcium, Ion 9.14  7.82 - 1.30 mmol/L    TCO2 25  0 - 100 mmol/L    Hemoglobin 9.9 (*) 12.0 - 15.0 g/dL    HCT 95.6 (*) 21.3 - 46.0 %   URINALYSIS, ROUTINE W REFLEX MICROSCOPIC     Status: Abnormal   Collection Time   05/31/12  1:27 AM      Component Value Range Comment   Color, Urine YELLOW  YELLOW    APPearance CLOUDY (*) CLEAR    Specific Gravity, Urine 1.030  1.005 - 1.030    pH 5.5  5.0 - 8.0    Glucose, UA NEGATIVE  NEGATIVE mg/dL    Hgb urine dipstick MODERATE (*) NEGATIVE    Bilirubin Urine NEGATIVE  NEGATIVE    Ketones, ur NEGATIVE  NEGATIVE mg/dL    Protein, ur NEGATIVE  NEGATIVE mg/dL    Urobilinogen, UA 0.2  0.0 - 1.0 mg/dL    Nitrite POSITIVE (*) NEGATIVE     Leukocytes, UA SMALL (*) NEGATIVE   URINE MICROSCOPIC-ADD ON     Status: Abnormal   Collection Time   05/31/12  1:27 AM      Component Value Range Comment   Squamous Epithelial / LPF MANY (*) RARE    WBC, UA 21-50  <3 WBC/hpf    RBC / HPF 0-2  <3 RBC/hpf    Bacteria, UA MANY (*) RARE    Urine-Other MUCOUS PRESENT      Dg Chest 1 View  05/31/2012  *RADIOLOGY REPORT*  Clinical Data: Leg injury.  CHEST - 1 VIEW  Comparison: 09/06/2011.  Findings: The patient is rotated to the left.  Pulmonary vascular congestion.  Cardiopericardial silhouette appears within normal limits.  Aortic atherosclerosis. There is no airspace disease identified.  Left basilar atelectasis. Healed right posterior rib fracture.  Osteopenia.  Healed proximal left humerus fracture.  IMPRESSION: No acute cardiopulmonary disease.  Chronic changes of the chest. Healed rib and left proximal humerus fractures.  Original Report Authenticated By: Andreas Newport, M.D.   Dg Hip Complete Left  05/31/2012  *RADIOLOGY REPORT*  Clinical Data: Leg pain.  LEFT HIP - COMPLETE 2+ VIEW  Comparison: None.  Findings: Severe osteopenia is present.  The obturator rings grossly appear intact.  Both hips are externally rotated.  No grossly displaced fracture of the femoral necks.  Consider obtaining repeat examination with external rotation of the hips if there is concern for hip fracture.  On the dedicated views of the left hip, there is no fracture plane identified and the trabecular markings appear within normal limits.  Extensive atherosclerotic calcification.  Technically poor the lateral view.  The hip grossly appears located. If there is a high clinical suspicion of hip fracture (patient refuses to bear weight), MRI is preferred in cases of radiographically occult hip fracture, particularly in osteopenic patients.  While CT is expeditious, evidence is lacking regarding accuracy over radiography.  IMPRESSION: No grossly displaced fracture.  Marked  osteopenia.  Original Report Authenticated By: Andreas Newport, M.D.   Dg Tibia/fibula Left  05/31/2012  *RADIOLOGY REPORT*  Clinical Data: Leg pain.  LEFT TIBIA AND FIBULA - 2 VIEW  Comparison: None.  Findings: Spiral fracture of the mid shaft of the left tibia is present.  There is one shaft lateral displacement of the tibia with apex dorsal angulation.  The proximal fibular diaphysis also shows a spiral fracture with one shaft lateral displacement.  There is apex dorsal angulation of the fibular fracture with one shaft width anterior displacement of the distal fibula.  Visualized hind foot appears within normal limits with a K-wire fixation in the metatarsals. Partial visualization of the left knee demonstrate severe osteoarthritis.  IMPRESSION: Spiral fractures of the shaft of the tibia and fibula.  Diffuse osteopenia.  Original Report Authenticated By: Andreas Newport, M.D.    Review of Systems  Constitutional: Negative.   HENT: Negative.   Eyes: Negative.   Respiratory: Negative.   Cardiovascular: Negative.   Gastrointestinal: Negative.   Genitourinary: Negative.   Musculoskeletal: Positive for joint pain.  Skin: Negative.   Endo/Heme/Allergies: Bruises/bleeds easily.  Psychiatric/Behavioral: Positive for memory loss.    Blood pressure 143/68, pulse 77, temperature 98.5 F (36.9 C), temperature source Oral, resp. rate 26, height 5\' 4"  (1.626 m), weight 56.7 kg (125 lb), SpO2 93.00%. Physical Exam left lower leg tender and swollen posterior splint in place ,dorsalis pedis pulse intact,x-r reveals fracture of the tibia and fibular left lower leg.  Assessment/Plan Medical consult/plan orif left tibia.  Kennieth Rad 05/31/2012, 9:38 AM

## 2012-05-31 NOTE — Progress Notes (Signed)
Clinical Social Work Department BRIEF PSYCHOSOCIAL ASSESSMENT 05/31/2012  Patient:  Brittany [name], Brittany [name]     Account Number:  [id number]     Admit date:  05/30/2012  Clinical Social Worker:  Candie Chroman  Date/Time:  05/31/2012 02:00 PM  Referred by:  Physician  Date Referred:  05/31/2012 Referred for  SNF Placement   Other Referral:   Interview type:  Family Other interview type:    PSYCHOSOCIAL DATA Living Status:  FACILITY Admitted from facility:  [facility] Level of care:  Skilled Nursing Facility Primary support name:  Kathryne Eriksson Primary support relationship to patient:  CHILD, ADULT Degree of support available:   supportive    CURRENT CONCERNS Current Concerns  Post-Acute Placement   Other Concerns:    SOCIAL WORK ASSESSMENT / PLAN Pt is an 78 yr old female with dementia admitted from [facility] . CSW met with family to assist with d/c planning. Family is holding pt's bed at [facility] but is interested in exploring other SNF options. Pt has been faxed out to [facility] and bed offer will be provided to family when available. Family may decide to have pt return to [facility] when ready for d/c.   Assessment/plan status:  Psychosocial Support/Ongoing Assessment of Needs Other assessment/ plan:   Information/referral to community resources:   SNF list provided    PATIENT'S/FAMILY'S RESPONSE TO PLAN OF CARE: Pt is holding bed at [facility] while other SNF possibilities are being explored.   Cori Razor LCSW [phone]

## 2012-05-31 NOTE — ED Provider Notes (Signed)
History     CSN: [id number]  Arrival date & time 05/30/12  2259   First MD Initiated Contact with Patient 05/30/12 2350      Chief Complaint  Patient presents with  . Leg Pain    (Consider location/radiation/quality/duration/timing/severity/associated sxs/prior treatment) The history is limited by the condition of the patient.   Hx per family and nursing home report.  PT has dementia, is not a reliable historian. Level 5 caveat applies.  Found to have deformity to LLE tibia and xrays performed at nursing home, sent here for possible Fx. No known mechanism, no other apparent injury reported. Mod in severity.  Past Medical History  Diagnosis Date  . Murmur, cardiac   . Mitral valve prolapse   . Arthritis   . DEMENTIA   . Stroke     hx 2 mini strokes    Past Surgical History  Procedure Date  . Knee arthroscopy     which knee unknown  . Back surgery   . Appendectomy     years ago    History reviewed. No pertinent family history.  History  Substance Use Topics  . Smoking status: Never Smoker   . Smokeless tobacco: Never Used  . Alcohol Use: No    OB History    Grav Para Term Preterm Abortions TAB SAB Ect Mult Living                  Review of Systems  Unable to perform ROS level 5 caveat as above Allergies  Penicillins  Home Medications   Current Outpatient Rx  Name Route Sig Dispense Refill  . ACETAMINOPHEN 325 MG PO TABS Oral Take 650 mg by mouth daily.    . ACETAMINOPHEN 500 MG PO TABS Oral Take 500 mg by mouth every 6 (six) hours as needed. pain     . CEPHALEXIN 250 MG PO CAPS Oral Take 250 mg by mouth at bedtime. For prophylaxis.    Marland Kitchen VITAMIN D 1000 UNITS PO TABS Oral Take 2,000 Units by mouth daily.    Marland Kitchen FERROUS SULFATE 325 (65 FE) MG PO TABS Oral Take 325 mg by mouth 2 (two) times daily.    Marland Kitchen HALOPERIDOL LACTATE 5 MG/ML IJ SOLN Intramuscular Inject 2 mg into the muscle every 6 (six) hours as needed. For agitation    . MEMANTINE HCL 10 MG PO TABS  Oral Take 10 mg by mouth 2 (two) times daily.    Marland Kitchen RISPERIDONE 0.5 MG PO TABS Oral Take 0.5 mg by mouth at bedtime.    Marland Kitchen RISPERIDONE 0.5 MG PO TABS Oral Take 0.5 mg by mouth 3 (three) times daily as needed.    Marland Kitchen RIVASTIGMINE 9.5 MG/24HR TD PT24 Transdermal Place 1 patch onto the skin daily. Remove old patch before placing new patch.    . SERTRALINE HCL 25 MG PO TABS Oral Take 75 mg by mouth daily.    Marland Kitchen VITAMIN B-12 100 MCG PO TABS Oral Take 100 mcg by mouth daily.      BP 144/67  Pulse 69  Temp 97.8 F (36.6 C) (Oral)  Resp 20  SpO2 94%  Physical Exam  Constitutional: She appears well-developed and well-nourished.  HENT:  Head: Normocephalic and atraumatic.  Eyes: Conjunctivae and EOM are normal. Pupils are equal, round, and reactive to light.  Neck: Trachea normal. Neck supple.       No midline posterior cervical tenderness  Cardiovascular: Normal rate, regular rhythm, S1 normal, S2 normal and normal pulses.  No systolic murmur is present   No diastolic murmur is present  Pulses:      Radial pulses are 2+ on the right side, and 2+ on the left side.  Pulmonary/Chest: Effort normal and breath sounds normal. She has no wheezes. She has no rhonchi. She has no rales. She exhibits no tenderness.  Abdominal: Soft. Normal appearance and bowel sounds are normal. There is no tenderness. There is no CVA tenderness and negative Murphy's sign.  Musculoskeletal:       LLE ecchymosis and swelling and tenderness over distal tib/ fib area. Ankle, knee and hip NTTP, pelvis stable. Distal n/v intact. External rotation of foot. No extremity tenderness or deformity otherwise.   Neurological: She is alert. She has normal strength. No cranial nerve deficit or sensory deficit. GCS eye subscore is 4. GCS verbal subscore is 5. GCS motor subscore is 6.  Skin: Skin is warm and dry. No rash noted. She is not diaphoretic.  Psychiatric: Her speech is normal.    ED Course  Procedures (including critical  care time)  Results for orders placed during the hospital encounter of 05/30/12  CBC      Component Value Range   WBC 9.7  4.0 - 10.5 K/uL   RBC 3.30 (*) 3.87 - 5.11 MIL/uL   Hemoglobin 10.1 (*) 12.0 - 15.0 g/dL   HCT 40.9 (*) 81.1 - 91.4 %   MCV 95.5  78.0 - 100.0 fL   MCH 30.6  26.0 - 34.0 pg   MCHC 32.1  30.0 - 36.0 g/dL   RDW 78.2  95.6 - 21.3 %   Platelets 220  150 - 400 K/uL  URINALYSIS, ROUTINE W REFLEX MICROSCOPIC      Component Value Range   Color, Urine YELLOW  YELLOW   APPearance CLOUDY (*) CLEAR   Specific Gravity, Urine 1.030  1.005 - 1.030   pH 5.5  5.0 - 8.0   Glucose, UA NEGATIVE  NEGATIVE mg/dL   Hgb urine dipstick MODERATE (*) NEGATIVE   Bilirubin Urine NEGATIVE  NEGATIVE   Ketones, ur NEGATIVE  NEGATIVE mg/dL   Protein, ur NEGATIVE  NEGATIVE mg/dL   Urobilinogen, UA 0.2  0.0 - 1.0 mg/dL   Nitrite POSITIVE (*) NEGATIVE   Leukocytes, UA SMALL (*) NEGATIVE  POCT I-STAT, CHEM 8      Component Value Range   Sodium 140  135 - 145 mEq/L   Potassium 4.1  3.5 - 5.1 mEq/L   Chloride 105  96 - 112 mEq/L   BUN 22  6 - 23 mg/dL   Creatinine, Ser 0.86  0.50 - 1.10 mg/dL   Glucose, Bld 578 (*) 70 - 99 mg/dL   Calcium, Ion 4.69  6.29 - 1.30 mmol/L   TCO2 25  0 - 100 mmol/L   Hemoglobin 9.9 (*) 12.0 - 15.0 g/dL   HCT 52.8 (*) 41.3 - 24.4 %  URINE MICROSCOPIC-ADD ON      Component Value Range   Squamous Epithelial / LPF MANY (*) RARE   WBC, UA 21-50  <3 WBC/hpf   RBC / HPF 0-2  <3 RBC/hpf   Bacteria, UA MANY (*) RARE   Urine-Other MUCOUS PRESENT     Dg Chest 1 View  05/31/2012  *RADIOLOGY REPORT*  Clinical Data: Leg injury.  CHEST - 1 VIEW  Comparison: 09/06/2011.  Findings: The patient is rotated to the left.  Pulmonary vascular congestion.  Cardiopericardial silhouette appears within normal limits.  Aortic atherosclerosis. There is no  airspace disease identified.  Left basilar atelectasis. Healed right posterior rib fracture.  Osteopenia.  Healed proximal left  humerus fracture.  IMPRESSION: No acute cardiopulmonary disease.  Chronic changes of the chest. Healed rib and left proximal humerus fractures.  Original Report Authenticated By: Andreas Newport, M.D.   Dg Hip Complete Left  05/31/2012  *RADIOLOGY REPORT*  Clinical Data: Leg pain.  LEFT HIP - COMPLETE 2+ VIEW  Comparison: None.  Findings: Severe osteopenia is present.  The obturator rings grossly appear intact.  Both hips are externally rotated.  No grossly displaced fracture of the femoral necks.  Consider obtaining repeat examination with external rotation of the hips if there is concern for hip fracture.  On the dedicated views of the left hip, there is no fracture plane identified and the trabecular markings appear within normal limits.  Extensive atherosclerotic calcification.  Technically poor the lateral view.  The hip grossly appears located. If there is a high clinical suspicion of hip fracture (patient refuses to bear weight), MRI is preferred in cases of radiographically occult hip fracture, particularly in osteopenic patients.  While CT is expeditious, evidence is lacking regarding accuracy over radiography.  IMPRESSION: No grossly displaced fracture.  Marked osteopenia.  Original Report Authenticated By: Andreas Newport, M.D.   Dg Tibia/fibula Left  05/31/2012  *RADIOLOGY REPORT*  Clinical Data: Leg pain.  LEFT TIBIA AND FIBULA - 2 VIEW  Comparison: None.  Findings: Spiral fracture of the mid shaft of the left tibia is present.  There is one shaft lateral displacement of the tibia with apex dorsal angulation.  The proximal fibular diaphysis also shows a spiral fracture with one shaft lateral displacement.  There is apex dorsal angulation of the fibular fracture with one shaft width anterior displacement of the distal fibula.  Visualized hind foot appears within normal limits with a K-wire fixation in the metatarsals. Partial visualization of the left knee demonstrate severe osteoarthritis.   IMPRESSION: Spiral fractures of the shaft of the tibia and fibula.  Diffuse osteopenia.  Original Report Authenticated By: Andreas Newport, M.D.   Left tib-fib fracture mechanism unknown.  Orthopedic consult obtained - d/w DR Myrtie Neither who agrees to admit, recs well padded posterior splint and will see in am,.   Pain control IV narcotics.   Splint placed by ortho tech and distal pulses and n/v intact. Compartments soft with moderate swelling and ecchymosis present.   MDM   Left tib-fib fracture evaluated with imaging as above. Pain improved intermittently with IV narcotic pain medication. UA reviewed as above. IV Cipro provided for UTI.   Orthopedic admit.         Sunnie Nielsen, MD 06/01/12 [phone]

## 2012-05-31 NOTE — Progress Notes (Signed)
Utilization review completed.  

## 2012-06-01 DIAGNOSIS — D649 Anemia, unspecified: Secondary | ICD-10-CM

## 2012-06-01 LAB — CBC
HCT: 28.9 % — ABNORMAL LOW (ref 36.0–46.0)
MCH: 31.1 pg (ref 26.0–34.0)
MCHC: 32.2 g/dL (ref 30.0–36.0)
MCV: 96.7 fL (ref 78.0–100.0)
RDW: 14.2 % (ref 11.5–15.5)
WBC: 12.1 10*3/uL — ABNORMAL HIGH (ref 4.0–10.5)

## 2012-06-01 LAB — BASIC METABOLIC PANEL
BUN: 10 mg/dL (ref 6–23)
CO2: 26 mEq/L (ref 19–32)
Chloride: 98 mEq/L (ref 96–112)
Creatinine, Ser: 0.58 mg/dL (ref 0.50–1.10)
Glucose, Bld: 115 mg/dL — ABNORMAL HIGH (ref 70–99)

## 2012-06-01 MED ORDER — RIVASTIGMINE 9.5 MG/24HR TD PT24
9.5000 mg | MEDICATED_PATCH | Freq: Every day | TRANSDERMAL | Status: DC
  Administered 2012-06-01 – 2012-06-04 (×4): 9.5 mg via TRANSDERMAL
  Filled 2012-06-01 (×5): qty 1

## 2012-06-01 MED ORDER — ACETAMINOPHEN 325 MG PO TABS
650.0000 mg | ORAL_TABLET | Freq: Every day | ORAL | Status: DC
  Administered 2012-06-02 – 2012-06-04 (×3): 650 mg via ORAL
  Filled 2012-06-01 (×5): qty 2

## 2012-06-01 MED ORDER — MEMANTINE HCL 10 MG PO TABS
10.0000 mg | ORAL_TABLET | Freq: Two times a day (BID) | ORAL | Status: DC
  Administered 2012-06-01 – 2012-06-04 (×7): 10 mg via ORAL
  Filled 2012-06-01 (×8): qty 1

## 2012-06-01 MED ORDER — OXYCODONE-ACETAMINOPHEN 5-325 MG PO TABS
1.0000 | ORAL_TABLET | ORAL | Status: DC | PRN
  Administered 2012-06-01 – 2012-06-03 (×6): 1 via ORAL
  Filled 2012-06-01 (×6): qty 1

## 2012-06-01 MED ORDER — ACETAMINOPHEN 500 MG PO TABS
500.0000 mg | ORAL_TABLET | Freq: Four times a day (QID) | ORAL | Status: DC | PRN
  Administered 2012-06-01 – 2012-06-02 (×2): 500 mg via ORAL
  Filled 2012-06-01 (×2): qty 1

## 2012-06-01 MED ORDER — FERROUS SULFATE 325 (65 FE) MG PO TABS
325.0000 mg | ORAL_TABLET | Freq: Two times a day (BID) | ORAL | Status: DC
  Administered 2012-06-01 – 2012-06-02 (×3): 325 mg via ORAL
  Filled 2012-06-01 (×7): qty 1

## 2012-06-01 MED ORDER — VITAMIN B-12 100 MCG PO TABS
100.0000 ug | ORAL_TABLET | Freq: Every day | ORAL | Status: DC
  Administered 2012-06-01 – 2012-06-04 (×4): 100 ug via ORAL
  Filled 2012-06-01 (×4): qty 1

## 2012-06-01 MED ORDER — CIPROFLOXACIN IN D5W 400 MG/200ML IV SOLN
400.0000 mg | Freq: Two times a day (BID) | INTRAVENOUS | Status: DC
  Administered 2012-06-01 – 2012-06-03 (×4): 400 mg via INTRAVENOUS
  Filled 2012-06-01 (×5): qty 200

## 2012-06-01 MED ORDER — WARFARIN VIDEO: Freq: Once | Status: DC

## 2012-06-01 MED ORDER — HALOPERIDOL LACTATE 5 MG/ML IJ SOLN: 2.0000 mg | Freq: Four times a day (QID) | INTRAMUSCULAR | Status: DC | PRN

## 2012-06-01 MED ORDER — WARFARIN SODIUM 4 MG PO TABS
4.0000 mg | ORAL_TABLET | Freq: Once | ORAL | Status: AC
  Administered 2012-06-01: 4 mg via ORAL
  Filled 2012-06-01 (×2): qty 1

## 2012-06-01 MED ORDER — COUMADIN BOOK
Freq: Once | Status: AC
  Administered 2012-06-01: 11:00:00
  Filled 2012-06-01: qty 1

## 2012-06-01 MED ORDER — HYDRALAZINE HCL 20 MG/ML IJ SOLN: 10.0000 mg | Freq: Three times a day (TID) | INTRAMUSCULAR | Status: DC | PRN

## 2012-06-01 MED ORDER — WARFARIN - PHARMACIST DOSING INPATIENT: Freq: Every day | Status: DC

## 2012-06-01 MED ORDER — VITAMIN D3 25 MCG (1000 UNIT) PO TABS
2000.0000 [IU] | ORAL_TABLET | Freq: Every day | ORAL | Status: DC
  Administered 2012-06-02 – 2012-06-04 (×3): 2000 [IU] via ORAL
  Filled 2012-06-01 (×4): qty 2

## 2012-06-01 MED ORDER — RISPERIDONE 0.5 MG PO TABS
0.5000 mg | ORAL_TABLET | Freq: Three times a day (TID) | ORAL | Status: DC | PRN
  Filled 2012-06-01: qty 1

## 2012-06-01 MED ORDER — SERTRALINE HCL 50 MG PO TABS
75.0000 mg | ORAL_TABLET | Freq: Every day | ORAL | Status: DC
  Administered 2012-06-01 – 2012-06-04 (×4): 75 mg via ORAL
  Filled 2012-06-01 (×4): qty 1

## 2012-06-01 MED ORDER — RISPERIDONE 0.5 MG PO TABS
0.5000 mg | ORAL_TABLET | Freq: Every day | ORAL | Status: DC
  Administered 2012-06-01 – 2012-06-03 (×4): 0.5 mg via ORAL
  Filled 2012-06-01 (×5): qty 1

## 2012-06-01 MED ORDER — DOCUSATE SODIUM 100 MG PO CAPS: 100.0000 mg | ORAL_CAPSULE | Freq: Two times a day (BID) | ORAL | Status: DC | PRN

## 2012-06-01 NOTE — Progress Notes (Signed)
ANTICOAGULATION CONSULT NOTE - Initial Consult  Pharmacy Consult for Warfarin Indication: VTE prophylaxis  Allergies  Allergen Reactions  . Penicillins Hives    Patient Measurements: Height: 5\' 4"  (162.6 cm) Weight: 125 lb (56.7 kg) IBW/kg (Calculated) : 54.7    Vital Signs: Temp: 97.7 F (36.5 C) (08/17 0023) Temp src: Axillary (08/17 0023) BP: 158/67 mmHg (08/17 0023) Pulse Rate: 72  (08/17 0023)  Labs:  Basename 05/31/12 1406 05/31/12 0023 05/30/12 2359  HGB -- 9.9* 10.1*  HCT -- 29.0* 31.5*  PLT -- -- 220  APTT 36 -- --  LABPROT 14.4 -- --  INR 1.10 -- --  HEPARINUNFRC -- -- --  CREATININE -- 0.70 --  CKTOTAL -- -- --  CKMB -- -- --  TROPONINI -- -- --    Estimated Creatinine Clearance: 43.6 ml/min (by C-G formula based on Cr of 0.7).   Medical History: Past Medical History  Diagnosis Date  . Murmur, cardiac   . Mitral valve prolapse   . Arthritis   . DEMENTIA   . Stroke     hx 2 mini strokes  . Iron deficiency anemia   . OA (osteoarthritis) of hip 05/31/2012  . OA (osteoarthritis) of knee 05/31/2012    Medications:  Scheduled:    . acetaminophen  650 mg Oral Daily  . chlorhexidine  60 mL Topical Once  . cholecalciferol  2,000 Units Oral Daily  . ciprofloxacin  400 mg Intravenous Once  . fentaNYL  50 mcg Intravenous Once  . ferrous sulfate  325 mg Oral BID PC  . memantine  10 mg Oral BID  . ondansetron (ZOFRAN) IV  4 mg Intravenous Once  . risperiDONE  0.5 mg Oral QHS  . rivastigmine  9.5 mg Transdermal Daily  . sertraline  75 mg Oral Daily  . vancomycin  1,000 mg Intravenous 60 min Pre-Op  . vitamin B-12  100 mcg Oral Daily  . DISCONTD: cefTRIAXone (ROCEPHIN)  IV  1 g Intravenous Q24H   Infusions:    . sodium chloride 125 mL/hr at 05/31/12 1533  . DISCONTD: sodium chloride Stopped (05/31/12 1610)    Assessment:  78 year old female with dementia s/p ORIF left tibia/fibula fracture  Coumadin to begin post-op for VTE  prophylaxis  Will give initial lower dose due to patient age and weight ~ 56 kg Goal of Therapy:  INR 2-3   Plan:   Warfarin 4mg  po x 1 today (will give in the AM ~ 8am)  Check daily PT/INR  Provide patient education (book/video)  Hibo Blasdell, Joselyn Glassman, PharmD 06/01/2012,1:01 AM

## 2012-06-01 NOTE — Evaluation (Addendum)
Physical Therapy Evaluation Patient Details Name: Brittany [name] MRN: [id number] DOB: February 05, 1924 Today's Date: 06/01/2012 Time: 4098-1191 PT Time Calculation (min): 18 min  PT Assessment / Plan / Recommendation Clinical Impression  Pt admitted for L LE pain and found to have L tibial and fibular fxs and now s/p ORIF and IM nailing of L tibia.  Pt with hx of dementia and very distrustful per family.  Pt would benefit from acute PT services in order to improve independence with bed mobility and transfers to prepare for d/c back to SNF.    PT Assessment  Patient needs continued PT services    Follow Up Recommendations  Skilled nursing facility    Barriers to Discharge        Equipment Recommendations  Defer to next venue    Recommendations for Other Services     Frequency Min 3X/week    Precautions / Restrictions Precautions Precautions: Fall Required Braces or Orthoses: Knee Immobilizer - Left Knee Immobilizer - Left: Other (comment) (no order but KI on pt upon entering room) Restrictions Weight Bearing Restrictions: Yes Other Position/Activity Restrictions: No WB status order.  Will keep NWB until updated.   Pertinent Vitals/Pain No complaints or signs of distress at rest, appears in pain with L LE movement however pt unable to rate      Mobility  Bed Mobility Bed Mobility: Supine to Sit;Sit to Supine Supine to Sit: 1: +2 Total assist;HOB elevated Supine to Sit: Patient Percentage: 10% Sit to Supine: 1: +2 Total assist;HOB elevated Sit to Supine: Patient Percentage: 10% Details for Bed Mobility Assistance: verbal cues for technique, pt wished to hold onto family and therapist's hands during transfer, upon sitting pt with rapid breathing and shakiness (family reports likely anxiety as pt has hx of this and distrusts people)  pt able to calm after sitting for a minute, also pt moans in pain with L LE movement requesting "raise it, raise it" Transfers Transfers: Not assessed     Exercises     PT Diagnosis: Generalized weakness;Acute pain;Difficulty walking  PT Problem List: Decreased strength;Decreased activity tolerance;Decreased mobility;Decreased safety awareness;Decreased knowledge of precautions;Decreased knowledge of use of DME;Pain PT Treatment Interventions: DME instruction;Functional mobility training;Therapeutic activities;Therapeutic exercise;Balance training;Patient/family education;Neuromuscular re-education;Wheelchair mobility training   PT Goals Acute Rehab PT Goals PT Goal Formulation: With patient/family Time For Goal Achievement: 06/15/12 Potential to Achieve Goals: Fair Pt will go Supine/Side to Sit: with mod assist PT Goal: Supine/Side to Sit - Progress: Goal set today Pt will go Sit to Supine/Side: with mod assist PT Goal: Sit to Supine/Side - Progress: Goal set today Pt will go Sit to Stand: with mod assist PT Goal: Sit to Stand - Progress: Goal set today Pt will go Stand to Sit: with mod assist PT Goal: Stand to Sit - Progress: Goal set today Pt will Transfer Bed to Chair/Chair to Bed: with mod assist PT Transfer Goal: Bed to Chair/Chair to Bed - Progress: Goal set today Pt will Propel Wheelchair: 10 - 50 feet;with min assist PT Goal: Propel Wheelchair - Progress: Goal set today  Visit Information  Last PT Received On: 06/01/12 Assistance Needed: +2    Subjective Data  Subjective: "I just might." (want more water)   Prior Functioning  Home Living Available Help at Discharge: Skilled Nursing Facility Type of Home: Skilled Nursing Facility Prior Function Level of Independence: Needs assistance Comments: Pt with dementia.  Family and caregiver report pt able to pull into standing using grab bar however not recently  ambulating.  Family member reports frustration with SNF using lift for transfers.  Family member hopes to get pt back to standing with RW. Communication Communication: Other (comment) (difficulty due to dementia)      Cognition  Overall Cognitive Status: History of cognitive impairments - at baseline Arousal/Alertness: Awake/alert    Extremity/Trunk Assessment Right Upper Extremity Assessment RUE ROM/Strength/Tone: WFL for tasks assessed Left Upper Extremity Assessment LUE ROM/Strength/Tone: WFL for tasks assessed Right Lower Extremity Assessment RLE ROM/Strength/Tone: Unable to fully assess;Due to impaired cognition Left Lower Extremity Assessment LLE ROM/Strength/Tone: Unable to fully assess;Due to impaired cognition;Due to precautions   Balance Balance Balance Assessed: Yes Static Sitting Balance Static Sitting - Balance Support: Left upper extremity supported;Feet supported Static Sitting - Level of Assistance: 5: Stand by assistance Static Sitting - Comment/# of Minutes: after pt calmed from sitting upright, pt able to sit EOB for 8 minutes and takes sips of water and bites of ice cream  End of Session PT - End of Session Equipment Utilized During Treatment: Left knee immobilizer Activity Tolerance: Patient limited by pain;Other (comment) (anxiety) Patient left: in bed;with call bell/phone within reach;with family/visitor present  GP     Browning Southwood,KATHrine E 06/01/2012, 1:11 PM Pager: [phone]

## 2012-06-01 NOTE — Op Note (Signed)
NAMEDRINA, JOBST NO.:  [id number]  MEDICAL RECORD NO.:  [id number]  LOCATION:  1612                         FACILITY:  [facility]  PHYSICIAN:  Myrtie Neither, MD      DATE OF BIRTH:  08-Jul-1924  DATE OF PROCEDURE:  05/31/2012 DATE OF DISCHARGE:                              OPERATIVE REPORT   PREOPERATIVE DIAGNOSIS:  Fractured left tibia and fibula.  POSTOPERATIVE DIAGNOSIS:  Fractured left tibia and fibula.  ANESTHESIA:  General.  PROCEDURE:  ORIF and IM nailing of left tibia, Biomet implant.  DESCRIPTION OF PROCEDURE:  The patient was taken to the operating room. After given adequate preop medications, given general anesthesia and intubated.  Left lower extremity was prepped with DuraPrep and draped in sterile manner.  Tourniquet and Bovie used for hemostasis.  C-arm was used to visualize the fracture reduction.  With the knee in the semi- flexed position, an anterior medial approach was made down to the skin and subcutaneous tissue from the tibial tuberosity up to the mid patella area went to the skin and subcutaneous tissue.  Then, a paramedian incision made into the capsule exposing the proximal tibial tuberosity. With use of an awl, was introduced into the tibial tuberosity just proximal to the tibial tuberosity followed by a drill guide.  This was visualized to pass across the fracture site.  Next, the guidewire was then placed down the shaft of the tibia while the fracture was held in reduced position.  Reaming was done over the guidewire up to a 12.5 after adequate reaming.  Measurement was done for the further IM nail and IM nail size was that of a 11 mm x 33 cm.  This was then attached over the guide pin, and the IM nail was having drilled across the fracture site holding it in anatomic position.  After the placement of the IM nail, 2 proximal cross fixing screws were placed proximally and 1 distally.  Copious irrigation was done.  Wound closure was  then done with 0 Vicryl for the fascia, 2-0 for the subcutaneous, and skin staples for the skin.  0.25% plain Marcaine was injected into the knee and about the separate incisions.  Compressive dressing was applied.  The immobilizer was applied.  The patient tolerated the procedure and went to recovery room in stable and satisfactory condition.     Myrtie Neither, MD     AC/MEDQ  D:  05/31/2012  T:  06/01/2012  Job:  [id number]

## 2012-06-01 NOTE — Progress Notes (Signed)
[facility] PROGRESS NOTE  CHRISHA VOGEL EAV:[id number] DOB: [birth year] DOA: 05/30/2012 PCP: No primary provider on file.  Assessment/Plan: Principal Problem:  *Fracture of tibia with fibula, left, closed Active Problems:  Knee effusion, left  Generalized pain  Dementia  Iron deficiency anemia  UTI (lower urinary tract infection)  Elevated BP  1 urinary tract infection  Will check urine cultures pending. For some reason patient's antibiotics d/c'd. Will resume IV cipro until cultures return. #2 dementia  Continue home regimen of Namenda, risperdal, Exelon patch, Zoloft.  #3 history of iron deficiency anemia  follow H&H. Continue home regimen of iron sulfate.  #4 spiral fracture of the left tibia and fibula   s/p ORIF and IM nail to L tibia.  Per primary team.  #5 prophylaxis  Will defer to primary team.   Code Status: Full Family Communication: No family at bedside Disposition Plan: SNF. Per primary team   Brief narrative: Brittany [name] is a 78 year old Caucasian female nursing home resident mainly wheelchair bound since November of 2012 with a history of dementia, mitral valve prolapse, iron deficiency anemia, prior history of mini strokes discovered incidentally on CT scan per patient's daughter, history of panic attacks, history of osteoarthritis who presented to the ED with left lower extremity pain. Patient is demented and a such was unable to obtain a history. History was obtained from the chart and per patient's daughter. Patient's daughter states that she was called from the nursing home that 2 CNA's were placing her mother in bed and the mother subsequently screamed and said her leg was broken. Patient was subsequently transferred to the ED.plain films of the left tibia and fibula show a spiral fractures of the shaft of the tibia and fibula. Patient's daughter states that patient does not have a history of coronary artery disease, patient has not had any history of  an acute MI, patient has not had any fevers, no chills, no nausea, no vomiting, no abdominal pain, no dysuria, no weakness,. Patient's daughter does endorse a mother has a cough. No other associated symptoms. Patient was admitted to the orthopedic service and will call for preoperative clearance.    Consultants:    Procedures:  ORIF and IM nail L tibia 05/31/12  Antibiotics:  IV Cipro 06/01/12  HPI/Subjective: Patient denies any pain. No complaints.  Objective: Filed Vitals:   06/01/12 0236 06/01/12 0330 06/01/12 0643 06/01/12 1300  BP: 166/75 167/64 181/73 184/68  Pulse: 87 88 105 86  Temp: 98.5 F (36.9 C) 98.7 F (37.1 C) 98.6 F (37 C) 98.4 F (36.9 C)  TempSrc: Axillary Axillary Axillary Oral  Resp: 16 16 16 18   Height:      Weight:      SpO2: 94% 93% 92% 91%    Intake/Output Summary (Last 24 hours) at 06/01/12 1403 Last data filed at 06/01/12 1045  Gross per 24 hour  Intake 2643.75 ml  Output    880 ml  Net 1763.75 ml   Filed Weights   05/31/12 0623 05/31/12 1504  Weight: 56.7 kg (125 lb) 56.7 kg (125 lb)    Exam: General: Alert, awake,  HEENT: No bruits, no goiter. Heart: Regular rate and rhythm, without murmurs, rubs, gallops. Lungs: Clear to auscultation bilaterally. Abdomen: Soft, nontender, nondistended, positive bowel sounds. Extremities: No clubbing cyanosis or edema with positive pedal pulses.LLE in immobilizer Neuro: Grossly intact, nonfocal.  Data Reviewed: Basic Metabolic Panel:  Lab 06/01/12 7829 05/31/12 0023  NA 132* 140  K 4.6 4.1  CL 98 105  CO2 26 --  GLUCOSE 115* 106*  BUN 10 22  CREATININE 0.58 0.70  CALCIUM 8.6 --  MG -- --  PHOS -- --   Liver Function Tests: No results found for this basename: AST:5,ALT:5,ALKPHOS:5,BILITOT:5,PROT:5,ALBUMIN:5 in the last 168 hours No results found for this basename: LIPASE:5,AMYLASE:5 in the last 168 hours No results found for this basename: AMMONIA:5 in the last 168 hours CBC:  Lab  06/01/12 0517 05/31/12 0023 05/30/12 2359  WBC 12.1* -- 9.7  NEUTROABS -- -- --  HGB 9.3* 9.9* 10.1*  HCT 28.9* 29.0* 31.5*  MCV 96.7 -- 95.5  PLT 223 -- 220   Cardiac Enzymes: No results found for this basename: CKTOTAL:5,CKMB:5,CKMBINDEX:5,TROPONINI:5 in the last 168 hours BNP (last 3 results) No results found for this basename: PROBNP:3 in the last 8760 hours CBG: No results found for this basename: GLUCAP:5 in the last 168 hours  No results found for this or any previous visit (from the past 240 hour(s)).   Studies: Dg Chest 1 View  05/31/2012  *RADIOLOGY REPORT*  Clinical Data: Leg injury.  CHEST - 1 VIEW  Comparison: 09/06/2011.  Findings: The patient is rotated to the left.  Pulmonary vascular congestion.  Cardiopericardial silhouette appears within normal limits.  Aortic atherosclerosis. There is no airspace disease identified.  Left basilar atelectasis. Healed right posterior rib fracture.  Osteopenia.  Healed proximal left humerus fracture.  IMPRESSION: No acute cardiopulmonary disease.  Chronic changes of the chest. Healed rib and left proximal humerus fractures.  Original Report Authenticated By: Andreas Newport, M.D.   Dg Hip Complete Left  05/31/2012  *RADIOLOGY REPORT*  Clinical Data: Leg pain.  LEFT HIP - COMPLETE 2+ VIEW  Comparison: None.  Findings: Severe osteopenia is present.  The obturator rings grossly appear intact.  Both hips are externally rotated.  No grossly displaced fracture of the femoral necks.  Consider obtaining repeat examination with external rotation of the hips if there is concern for hip fracture.  On the dedicated views of the left hip, there is no fracture plane identified and the trabecular markings appear within normal limits.  Extensive atherosclerotic calcification.  Technically poor the lateral view.  The hip grossly appears located. If there is a high clinical suspicion of hip fracture (patient refuses to bear weight), MRI is preferred in cases of  radiographically occult hip fracture, particularly in osteopenic patients.  While CT is expeditious, evidence is lacking regarding accuracy over radiography.  IMPRESSION: No grossly displaced fracture.  Marked osteopenia.  Original Report Authenticated By: Andreas Newport, M.D.   Dg Tibia/fibula Left  05/31/2012  *RADIOLOGY REPORT*  Clinical Data: Left tibia IM rod.  LEFT TIBIA AND FIBULA - 2 VIEW  Comparison: 05/31/2012  Findings: Seven intraoperative spot images demonstrate placement of IM rod across the left tibial fracture.  Near anatomic alignment on these spot images.  The fibular fracture is not well visualized.  IMPRESSION: Internal fixation left tibial fracture.  Original Report Authenticated By: Cyndie Chime, M.D.   Dg Tibia/fibula Left  05/31/2012  *RADIOLOGY REPORT*  Clinical Data: Leg pain.  LEFT TIBIA AND FIBULA - 2 VIEW  Comparison: None.  Findings: Spiral fracture of the mid shaft of the left tibia is present.  There is one shaft lateral displacement of the tibia with apex dorsal angulation.  The proximal fibular diaphysis also shows a spiral fracture with one shaft lateral displacement.  There is apex dorsal angulation of the fibular fracture with one shaft width anterior  displacement of the distal fibula.  Visualized hind foot appears within normal limits with a K-wire fixation in the metatarsals. Partial visualization of the left knee demonstrate severe osteoarthritis.  IMPRESSION: Spiral fractures of the shaft of the tibia and fibula.  Diffuse osteopenia.  Original Report Authenticated By: Andreas Newport, M.D.   Dg C-arm 1-60 Min-no Report  05/31/2012  CLINICAL DATA: surgery   C-ARM 1-60 MINUTES  Fluoroscopy was utilized by the requesting physician.  No radiographic  interpretation.      Scheduled Meds:   . acetaminophen  650 mg Oral Daily  . cholecalciferol  2,000 Units Oral Daily  . coumadin book   Does not apply Once  . ferrous sulfate  325 mg Oral BID PC  . memantine  10 mg  Oral BID  . risperiDONE  0.5 mg Oral QHS  . rivastigmine  9.5 mg Transdermal Daily  . sertraline  75 mg Oral Daily  . vancomycin  1,000 mg Intravenous 60 min Pre-Op  . vitamin B-12  100 mcg Oral Daily  . warfarin  4 mg Oral Once  . warfarin   Does not apply Once  . Warfarin - Pharmacist Dosing Inpatient   Does not apply q1800  . DISCONTD: cefTRIAXone (ROCEPHIN)  IV  1 g Intravenous Q24H   Continuous Infusions:   . sodium chloride 1,000 mL (06/01/12 1140)    Principal Problem:  *Fracture of tibia with fibula, left, closed Active Problems:  Knee effusion, left  Generalized pain  Dementia  Iron deficiency anemia  UTI (lower urinary tract infection)  Elevated BP    Time spent: > 30 mins    [facility]  [facility] Pager [phone]. If 8PM-8AM, please contact night-coverage at [email or web address], password [facility] 06/01/2012, 2:03 PM  LOS: 2 days

## 2012-06-01 NOTE — Progress Notes (Signed)
Subjective: 1 Day Post-Op Procedure(s) (LRB): INTRAMEDULLARY (IM) NAIL TIBIAL (Left) Patient reports pain as 4 on 0-10 scale.    Objective: Vital signs in last 24 hours: Temp:  [97.1 F (36.2 C)-98.7 F (37.1 C)] 98.4 F (36.9 C) (08/17 1515) Pulse Rate:  [71-105] 88  (08/17 1515) Resp:  [15-20] 16  (08/17 1515) BP: (141-198)/(54-75) 154/68 mmHg (08/17 1515) SpO2:  [90 %-100 %] 96 % (08/17 1515)  Intake/Output from previous day: 08/16 0701 - 08/17 0700 In: 2050 [I.V.:2050] Out: 880 [Urine:860; Blood:20] Intake/Output this shift:     Basename 06/01/12 0517 05/31/12 0023 05/30/12 2359  HGB 9.3* 9.9* 10.1*    Basename 06/01/12 0517 05/31/12 0023 05/30/12 2359  WBC 12.1* -- 9.7  RBC 2.99* -- 3.30*  HCT 28.9* 29.0* --  PLT 223 -- 220    Basename 06/01/12 0517 05/31/12 0023  NA 132* 140  K 4.6 4.1  CL 98 105  CO2 26 --  BUN 10 22  CREATININE 0.58 0.70  GLUCOSE 115* 106*  CALCIUM 8.6 --    Basename 05/31/12 1406  LABPT --  INR 1.10    Neurologically intact  Assessment/Plan: 1 Day Post-Op Procedure(s) (LRB): INTRAMEDULLARY (IM) NAIL TIBIAL (Left) Up with therapy  Ameya Vowell F 06/01/2012, 8:41 PM

## 2012-06-01 NOTE — H&P (Signed)
[name], [name] NO.:  [id number]  MEDICAL RECORD NO.:  [id number]  LOCATION:  1617                         FACILITY:  [facility]  PHYSICIAN:  Brittany Neither, MD      DATE OF BIRTH:  12/21/1923  DATE OF ADMISSION:  05/30/2012 DATE OF DISCHARGE:                             HISTORY & PHYSICAL   CHIEF COMPLAINT:  Painful left lower leg.  HISTORY OF PRESENT ILLNESS:  This is an 78 year old female who resides at [facility].  The patient has history of dementia.  The patient was found complaining of pain to her left lower leg yesterday. Source of injury, unknown.  The patient was transferred to [facility] Emergency Room.  The patient was found to have fractured left tibia and fibula displaced on the left lower leg and was admitted for treatment.  PAST MEDICAL HISTORY:  Dementia, __________, mitral valve prolapse, degenerative joint disease.  The patient had history of stroke in the past and cardiac murmur.  FAMILY HISTORY:  Noncontributory.  SOCIAL HISTORY:  Negative for use of alcohol, tobacco, and illegal drugs.  ALLERGIES:  PENICILLIN.  MEDICATIONS:  Zoloft.  PHYSICAL EXAMINATION:  GENERAL:  The patient is disoriented __________. VITAL SIGNS:  Temperature is 98.5, pulse 77, respirations 26, blood pressure 123/68, O2 saturation 93%, height of 5 feet 4 inches, weight 56.7 kg. HEAD:  Normocephalic and [QAMARKER]. NECK:  Limited range of motion.  Nontender. EXTREMITIES:  The patient is able to actively move both upper extremities, though complained of pain in the right lower extremity with dry skin, psoriatic skin change.  Left lower extremity, tender, swollen, with palpable dorsalis pedis pulse.  The nail bed is pink and __________.  X-ray revealed a fracture of the left tibia and fibula at the junction of mid and distal third osteoporotic bone.  Impression is that of a fracture of left tibia and fibula, dementia, prolapsed valve,  degenerative joint disease.  PLAN:  __________ evaluation followed by possible ORIF, tibia IM nail.     Brittany Neither, MD     AC/MEDQ  D:  05/31/2012  T:  06/01/2012  Job:  [id number]

## 2012-06-02 ENCOUNTER — Other Ambulatory Visit: Payer: Self-pay | Admitting: Orthopedic Surgery

## 2012-06-02 DIAGNOSIS — D62 Acute posthemorrhagic anemia: Secondary | ICD-10-CM | POA: Clinically undetermined

## 2012-06-02 LAB — IRON AND TIBC
Saturation Ratios: 5 % — ABNORMAL LOW (ref 20–55)
TIBC: 204 ug/dL — ABNORMAL LOW (ref 250–470)
UIBC: 194 ug/dL (ref 125–400)

## 2012-06-02 LAB — URINE CULTURE: Colony Count: >=100000

## 2012-06-02 LAB — BASIC METABOLIC PANEL
BUN: 11 mg/dL (ref 6–23)
CO2: 27 mEq/L (ref 19–32)
Chloride: 100 mEq/L (ref 96–112)
Creatinine, Ser: 0.61 mg/dL (ref 0.50–1.10)
GFR calc Af Amer: >90 mL/min (ref >90)

## 2012-06-02 LAB — CBC
HCT: 22.4 % — ABNORMAL LOW (ref 36.0–46.0)
MCH: 31.2 pg (ref 26.0–34.0)
MCV: 94.9 fL (ref 78.0–100.0)
RDW: 14.3 % (ref 11.5–15.5)
WBC: 7 10*3/uL (ref 4.0–10.5)

## 2012-06-02 LAB — FERRITIN: Ferritin: 326 ng/mL — ABNORMAL HIGH (ref 10–291)

## 2012-06-02 LAB — VITAMIN B12: Vitamin B-12: 618 pg/mL (ref 211–911)

## 2012-06-02 MED ORDER — FUROSEMIDE 10 MG/ML IJ SOLN
40.0000 mg | Freq: Once | INTRAMUSCULAR | Status: AC | PRN
  Administered 2012-06-02: 40 mg via INTRAVENOUS
  Filled 2012-06-02: qty 4

## 2012-06-02 MED ORDER — POTASSIUM CHLORIDE CRYS ER 20 MEQ PO TBCR
40.0000 meq | EXTENDED_RELEASE_TABLET | Freq: Once | ORAL | Status: AC
  Administered 2012-06-02: 40 meq via ORAL
  Filled 2012-06-02: qty 2

## 2012-06-02 MED ORDER — DIPHENHYDRAMINE HCL 12.5 MG/5ML PO ELIX
12.5000 mg | ORAL_SOLUTION | Freq: Four times a day (QID) | ORAL | Status: DC | PRN
Start: 2012-06-02 — End: 2012-06-04
  Administered 2012-06-02: 12.5 mg via ORAL
  Filled 2012-06-02: qty 5

## 2012-06-02 MED ORDER — WARFARIN SODIUM 1 MG PO TABS
1.0000 mg | ORAL_TABLET | Freq: Once | ORAL | Status: AC
Start: 2012-06-02 — End: 2012-06-02
  Administered 2012-06-02: 1 mg via ORAL
  Filled 2012-06-02: qty 1

## 2012-06-02 NOTE — Progress Notes (Signed)
Physical Therapy Treatment Patient Details Name: Brittany [name] MRN: [id number] DOB: August 24, 1924 Today's Date: 06/02/2012 Time: 3086-5784 PT Time Calculation (min): 14 min  PT Assessment / Plan / Recommendation Comments on Treatment Session  Pt receiving PRBCs due to low Hgb so performed a few exercises in supine.  Pt required increased encouragement due to dementia and able to assist with exercises after performing movement a couple times.    Follow Up Recommendations  Skilled nursing facility    Barriers to Discharge        Equipment Recommendations  Defer to next venue    Recommendations for Other Services    Frequency     Plan Discharge plan remains appropriate;Frequency remains appropriate    Precautions / Restrictions Precautions Precautions: Fall Restrictions Other Position/Activity Restrictions: No WB status order. Will keep NWB until updated   Pertinent Vitals/Pain No c/o of pain or signs of distress    Mobility       Exercises General Exercises - Lower Extremity Ankle Circles/Pumps: AROM;Both;15 reps;Supine;Limitations Ankle Circles/Pumps Limitations: less ROM observed on L ankle Short Arc Quad: AAROM;Right;20 reps;Supine;Strengthening Heel Slides: AAROM;10 reps;Supine Hip ABduction/ADduction: AAROM;Strengthening;Right;20 reps;Supine   PT Diagnosis:    PT Problem List:   PT Treatment Interventions:     PT Goals Acute Rehab PT Goals PT Goal: Supine/Side to Sit - Progress: Progressing toward goal  Visit Information  Last PT Received On: 06/02/12 Assistance Needed: +2    Subjective Data  Subjective: If we need to do exercises lets just go ahead and do them.   Cognition  Overall Cognitive Status: History of cognitive impairments - at baseline Arousal/Alertness: Lethargic Cognition - Other Comments: pt just given pain meds and very sleepy    Balance     End of Session PT - End of Session Patient left: in bed;with call bell/phone within reach;with  family/visitor present   GP     Orest Dygert,KATHrine E 06/02/2012, 11:35 AM Pager: [phone]

## 2012-06-02 NOTE — Progress Notes (Signed)
[facility] PROGRESS NOTE  Brittany [name] ION:[id number] DOB: November 27, 1923 DOA: 05/30/2012 PCP: No primary provider on file.  Assessment/Plan: Principal Problem:  *Fracture of tibia with fibula, left, closed Active Problems:  Knee effusion, left  Generalized pain  Hypokalemia  Dementia  Iron deficiency anemia  UTI (lower urinary tract infection)  Elevated BP  Acute blood loss anemia  1 Enterobacter clocae urinary tract infection  Continue IV Cipro D2. #2 dementia  Continue home regimen of Namenda, risperdal, Exelon patch, Zoloft.  #3 history of iron deficiency anemia/acute blood loss anemia Patient's hemoglobin today 7.3 from 9.3. No overt GI bleed. Likely acute blood loss anemia secondary to surgery. Agree with transfusion of 2 units of packed red blood cells. Follow H&H. Continue home regimen of iron sulfate.  #4 spiral fracture of the left tibia and fibula   s/p ORIF and IM nail to L tibia.  Per primary team.  #5 prophylaxis  Will defer to primary team.   Code Status: Full Family Communication: Updated daughter at bedside Disposition Plan: SNF. Per primary team   Brief narrative: Brittany [name] is a 78 year old Caucasian female nursing home resident mainly wheelchair bound since November of 2012 with a history of dementia, mitral valve prolapse, iron deficiency anemia, prior history of mini strokes discovered incidentally on CT scan per patient's daughter, history of panic attacks, history of osteoarthritis who presented to the ED with left lower extremity pain. Patient is demented and a such was unable to obtain a history. History was obtained from the chart and per patient's daughter. Patient's daughter states that she was called from the nursing home that 2 CNA's were placing her mother in bed and the mother subsequently screamed and said her leg was broken. Patient was subsequently transferred to the ED.plain films of the left tibia and fibula show a spiral fractures of  the shaft of the tibia and fibula. Patient's daughter states that patient does not have a history of coronary artery disease, patient has not had any history of an acute MI, patient has not had any fevers, no chills, no nausea, no vomiting, no abdominal pain, no dysuria, no weakness,. Patient's daughter does endorse a mother has a cough. No other associated symptoms. Patient was admitted to the orthopedic service and will call for preoperative clearance.    Consultants:    Procedures:  ORIF and IM nail L tibia 05/31/12  Antibiotics:  IV Cipro 06/01/12  HPI/Subjective: Patient is sleeping, easily arousable. Patient denies any pain. No complaints.  Objective: Filed Vitals:   06/02/12 1231 06/02/12 1312 06/02/12 1449 06/02/12 1506  BP: 133/67 103/55 118/62 128/61  Pulse: 83 76 82 79  Temp: 98.2 F (36.8 C) 98.2 F (36.8 C) 97.9 F (36.6 C) 97.7 F (36.5 C)  TempSrc: Oral Oral Oral Oral  Resp: 16 16 16 16   Height:      Weight:      SpO2:  94%      Intake/Output Summary (Last 24 hours) at 06/02/12 1555 Last data filed at 06/02/12 1449  Gross per 24 hour  Intake 2877.74 ml  Output   1660 ml  Net 1217.74 ml   Filed Weights   05/31/12 0623 05/31/12 1504  Weight: 56.7 kg (125 lb) 56.7 kg (125 lb)    Exam: General: Alert, awake,  HEENT: No bruits, no goiter. Heart: Regular rate and rhythm, without murmurs, rubs, gallops. Lungs: Clear to auscultation bilaterally. Abdomen: Soft, nontender, nondistended, positive bowel sounds. Extremities: No clubbing cyanosis or edema with  positive pedal pulses.LLE in immobilizer Neuro: Grossly intact, nonfocal.  Data Reviewed: Basic Metabolic Panel:  Lab 06/02/12 1610 06/02/12 0433 06/01/12 0517 05/31/12 0023  NA -- 133* 132* 140  K -- 3.3* 4.6 4.1  CL -- 100 98 105  CO2 -- 27 26 --  GLUCOSE -- 127* 115* 106*  BUN -- 11 10 22   CREATININE -- 0.61 0.58 0.70  CALCIUM -- 8.0* 8.6 --  MG 1.8 -- -- --  PHOS -- -- -- --   Liver  Function Tests: No results found for this basename: AST:5,ALT:5,ALKPHOS:5,BILITOT:5,PROT:5,ALBUMIN:5 in the last 168 hours No results found for this basename: LIPASE:5,AMYLASE:5 in the last 168 hours No results found for this basename: AMMONIA:5 in the last 168 hours CBC:  Lab 06/02/12 0433 06/01/12 0517 05/31/12 0023 05/30/12 2359  WBC 7.0 12.1* -- 9.7  NEUTROABS -- -- -- --  HGB 7.3* 9.3* 9.9* 10.1*  HCT 22.4* 28.9* 29.0* 31.5*  MCV 94.9 96.7 -- 95.5  PLT 199 223 -- 220   Cardiac Enzymes: No results found for this basename: CKTOTAL:5,CKMB:5,CKMBINDEX:5,TROPONINI:5 in the last 168 hours BNP (last 3 results) No results found for this basename: PROBNP:3 in the last 8760 hours CBG: No results found for this basename: GLUCAP:5 in the last 168 hours  Recent Results (from the past 240 hour(s))  URINE CULTURE     Status: Normal   Collection Time   05/31/12  2:29 AM      Component Value Range Status Comment   Specimen Description URINE, CATHETERIZED   Final    Special Requests NONE   Final    Culture  Setup Time 05/31/2012 18:12   Final    Colony Count >=100,000 COLONIES/ML   Final    Culture ENTEROBACTER CLOACAE   Final    Report Status 06/02/2012 FINAL   Final    Organism ID, Bacteria ENTEROBACTER CLOACAE   Final      Studies: Dg Chest 1 View  05/31/2012  *RADIOLOGY REPORT*  Clinical Data: Leg injury.  CHEST - 1 VIEW  Comparison: 09/06/2011.  Findings: The patient is rotated to the left.  Pulmonary vascular congestion.  Cardiopericardial silhouette appears within normal limits.  Aortic atherosclerosis. There is no airspace disease identified.  Left basilar atelectasis. Healed right posterior rib fracture.  Osteopenia.  Healed proximal left humerus fracture.  IMPRESSION: No acute cardiopulmonary disease.  Chronic changes of the chest. Healed rib and left proximal humerus fractures.  Original Report Authenticated By: Andreas Newport, M.D.   Dg Hip Complete Left  05/31/2012  *RADIOLOGY  REPORT*  Clinical Data: Leg pain.  LEFT HIP - COMPLETE 2+ VIEW  Comparison: None.  Findings: Severe osteopenia is present.  The obturator rings grossly appear intact.  Both hips are externally rotated.  No grossly displaced fracture of the femoral necks.  Consider obtaining repeat examination with external rotation of the hips if there is concern for hip fracture.  On the dedicated views of the left hip, there is no fracture plane identified and the trabecular markings appear within normal limits.  Extensive atherosclerotic calcification.  Technically poor the lateral view.  The hip grossly appears located. If there is a high clinical suspicion of hip fracture (patient refuses to bear weight), MRI is preferred in cases of radiographically occult hip fracture, particularly in osteopenic patients.  While CT is expeditious, evidence is lacking regarding accuracy over radiography.  IMPRESSION: No grossly displaced fracture.  Marked osteopenia.  Original Report Authenticated By: Andreas Newport, M.D.   Dg  Tibia/fibula Left  05/31/2012  *RADIOLOGY REPORT*  Clinical Data: Left tibia IM rod.  LEFT TIBIA AND FIBULA - 2 VIEW  Comparison: 05/31/2012  Findings: Seven intraoperative spot images demonstrate placement of IM rod across the left tibial fracture.  Near anatomic alignment on these spot images.  The fibular fracture is not well visualized.  IMPRESSION: Internal fixation left tibial fracture.  Original Report Authenticated By: Cyndie Chime, M.D.   Dg Tibia/fibula Left  05/31/2012  *RADIOLOGY REPORT*  Clinical Data: Leg pain.  LEFT TIBIA AND FIBULA - 2 VIEW  Comparison: None.  Findings: Spiral fracture of the mid shaft of the left tibia is present.  There is one shaft lateral displacement of the tibia with apex dorsal angulation.  The proximal fibular diaphysis also shows a spiral fracture with one shaft lateral displacement.  There is apex dorsal angulation of the fibular fracture with one shaft width anterior  displacement of the distal fibula.  Visualized hind foot appears within normal limits with a K-wire fixation in the metatarsals. Partial visualization of the left knee demonstrate severe osteoarthritis.  IMPRESSION: Spiral fractures of the shaft of the tibia and fibula.  Diffuse osteopenia.  Original Report Authenticated By: Andreas Newport, M.D.   Dg C-arm 1-60 Min-no Report  05/31/2012  CLINICAL DATA: surgery   C-ARM 1-60 MINUTES  Fluoroscopy was utilized by the requesting physician.  No radiographic  interpretation.      Scheduled Meds:    . acetaminophen  650 mg Oral Daily  . cholecalciferol  2,000 Units Oral Daily  . ciprofloxacin  400 mg Intravenous Q12H  . ferrous sulfate  325 mg Oral BID PC  . memantine  10 mg Oral BID  . potassium chloride  40 mEq Oral Once  . risperiDONE  0.5 mg Oral QHS  . rivastigmine  9.5 mg Transdermal Daily  . sertraline  75 mg Oral Daily  . vitamin B-12  100 mcg Oral Daily  . warfarin  1 mg Oral ONCE-1800  . warfarin   Does not apply Once  . Warfarin - Pharmacist Dosing Inpatient   Does not apply q1800   Continuous Infusions:    . DISCONTD: sodium chloride 100 mL/hr at 06/01/12 2222    Principal Problem:  *Fracture of tibia with fibula, left, closed Active Problems:  Knee effusion, left  Generalized pain  Hypokalemia  Dementia  Iron deficiency anemia  UTI (lower urinary tract infection)  Elevated BP  Acute blood loss anemia    Time spent: > 30 mins    [facility]  [facility] Pager [phone]. If 8PM-8AM, please contact night-coverage at [email or web address], password [facility] 06/02/2012, 3:55 PM  LOS: 3 days

## 2012-06-02 NOTE — Progress Notes (Signed)
ANTICOAGULATION CONSULT NOTE - Follow Up Consult  Pharmacy Consult for Warfarin Indication: VTE Prophylaxis  Allergies  Allergen Reactions  . Penicillins Hives    Patient Measurements: Height: 5\' 4"  (162.6 cm) Weight: 125 lb (56.7 kg) IBW/kg (Calculated) : 54.7   Vital Signs: Temp: 98.9 F (37.2 C) (08/17 2221) Temp src: Oral (08/17 2221) BP: 115/63 mmHg (08/17 2221) Pulse Rate: 77  (08/17 2221)  Labs:  Basename 06/02/12 0433 06/01/12 0517 05/31/12 1406 05/31/12 0023 05/30/12 2359  HGB 7.3* 9.3* -- -- --  HCT 22.4* 28.9* -- 29.0* --  PLT 199 223 -- -- 220  APTT -- -- 36 -- --  LABPROT 19.1* -- 14.4 -- --  INR 1.57* -- 1.10 -- --  HEPARINUNFRC -- -- -- -- --  CREATININE 0.61 0.58 -- 0.70 --  CKTOTAL -- -- -- -- --  CKMB -- -- -- -- --  TROPONINI -- -- -- -- --    Estimated Creatinine Clearance: 43.6 ml/min (by C-G formula based on Cr of 0.61).   Medications:  Scheduled:    . acetaminophen  650 mg Oral Daily  . cholecalciferol  2,000 Units Oral Daily  . ciprofloxacin  400 mg Intravenous Q12H  . coumadin book   Does not apply Once  . ferrous sulfate  325 mg Oral BID PC  . memantine  10 mg Oral BID  . risperiDONE  0.5 mg Oral QHS  . rivastigmine  9.5 mg Transdermal Daily  . sertraline  75 mg Oral Daily  . vitamin B-12  100 mcg Oral Daily  . warfarin  4 mg Oral Once  . warfarin   Does not apply Once  . Warfarin - Pharmacist Dosing Inpatient   Does not apply q1800   Infusions:    . sodium chloride 100 mL/hr at 06/01/12 2222    Assessment: 78 year old female with dementia s/p ORIF left tibia/fibula fracture on 8/16. Warfarin started 8/17am for VTE prophylaxis, using lower dose due to patient age and wt ~ 28kg. INR jumped from 1.1--> 1.57 after only one dose of warfarin (unexpectedly quick rise; drug interaction with Cipro could be contributing). Will use even lower warfarin dose tonight to slow INR progression. No bleeding reported in chart notes; H/H trending  down, to get transfused 2 units PRBC today.  Goal of Therapy:  INR 2-3   Plan:  1) Warfarin 1mg  PO x1 at 18:00. 2) Daily PT/INR 3) Warfarin education to be completed prior to discharge if able to with hx of dementia.  Darrol Angel, PharmD Pager: [phone] 06/02/2012,7:36 AM

## 2012-06-02 NOTE — Progress Notes (Signed)
Subjective: 2 Days Post-Op Procedure(s) (LRB): INTRAMEDULLARY (IM) NAIL TIBIAL (Left) Patient reports pain as 2 on 0-10 scale.    Objective: Vital signs in last 24 hours: Temp:  [97.7 F (36.5 C)-99.2 F (37.3 C)] 98.3 F (36.8 C) (08/18 1806) Pulse Rate:  [76-91] 91  (08/18 1806) Resp:  [16-17] 16  (08/18 1806) BP: (103-154)/(53-67) 116/57 mmHg (08/18 1806) SpO2:  [91 %-94 %] 94 % (08/18 1312)  Intake/Output from previous day: 08/17 0701 - 08/18 0700 In: 2787.1 [P.O.:600; I.V.:2187.1] Out: 2160 [Urine:2160] Intake/Output this shift:     Basename 06/02/12 0433 06/01/12 0517 05/31/12 0023 05/30/12 2359  HGB 7.3* 9.3* 9.9* 10.1*    Basename 06/02/12 0433 06/01/12 0517  WBC 7.0 12.1*  RBC 2.34* 2.99*  HCT 22.4* 28.9*  PLT 199 223    Basename 06/02/12 0433 06/01/12 0517  NA 133* 132*  K 3.3* 4.6  CL 100 98  CO2 27 26  BUN 11 10  CREATININE 0.61 0.58  GLUCOSE 127* 115*  CALCIUM 8.0* 8.6    Basename 06/02/12 0433 05/31/12 1406  LABPT -- --  INR 1.57* 1.10    Neurologically intactpain under control, await   H&h  after transfusion, plan discharge once family chooses location.  Assessment/Plan: 2 Days Post-Op Procedure(s) (LRB): INTRAMEDULLARY (IM) NAIL TIBIAL (Left) Discharge to SNFdischarge to nh facility.  Kennieth Rad 06/02/2012, 7:45 PM

## 2012-06-03 ENCOUNTER — Other Ambulatory Visit: Payer: Self-pay | Admitting: Orthopedic Surgery

## 2012-06-03 ENCOUNTER — Inpatient Hospital Stay (HOSPITAL_COMMUNITY): Payer: Medicare Other

## 2012-06-03 DIAGNOSIS — S82209A Unspecified fracture of shaft of unspecified tibia, initial encounter for closed fracture: Secondary | ICD-10-CM

## 2012-06-03 DIAGNOSIS — R030 Elevated blood-pressure reading, without diagnosis of hypertension: Secondary | ICD-10-CM

## 2012-06-03 DIAGNOSIS — D62 Acute posthemorrhagic anemia: Secondary | ICD-10-CM

## 2012-06-03 LAB — PROTIME-INR
INR: 1.38 (ref 0.00–1.49)
Prothrombin Time: 17.2 seconds — ABNORMAL HIGH (ref 11.6–15.2)

## 2012-06-03 LAB — TYPE AND SCREEN
Antibody Screen: NEGATIVE
Unit division: 0

## 2012-06-03 LAB — CBC
HCT: 27.7 % — ABNORMAL LOW (ref 36.0–46.0)
MCHC: 35 g/dL (ref 30.0–36.0)
Platelets: 178 10*3/uL (ref 150–400)
RDW: 16.7 % — ABNORMAL HIGH (ref 11.5–15.5)
WBC: 8.2 10*3/uL (ref 4.0–10.5)

## 2012-06-03 LAB — BASIC METABOLIC PANEL
BUN: 13 mg/dL (ref 6–23)
Calcium: 8.1 mg/dL — ABNORMAL LOW (ref 8.4–10.5)
Chloride: 100 mEq/L (ref 96–112)
Creatinine, Ser: 0.54 mg/dL (ref 0.50–1.10)
GFR calc Af Amer: >90 mL/min (ref >90)
GFR calc non Af Amer: 83 mL/min — ABNORMAL LOW (ref >90)

## 2012-06-03 MED ORDER — IPRATROPIUM BROMIDE 0.02 % IN SOLN
0.5000 mg | Freq: Three times a day (TID) | RESPIRATORY_TRACT | Status: DC
  Administered 2012-06-04: 0.5 mg via RESPIRATORY_TRACT
  Filled 2012-06-03: qty 2.5

## 2012-06-03 MED ORDER — ALBUTEROL SULFATE (5 MG/ML) 0.5% IN NEBU
2.5000 mg | INHALATION_SOLUTION | RESPIRATORY_TRACT | Status: DC | PRN
  Administered 2012-06-03: 2.5 mg via RESPIRATORY_TRACT

## 2012-06-03 MED ORDER — ALBUTEROL SULFATE (5 MG/ML) 0.5% IN NEBU: 2.5000 mg | INHALATION_SOLUTION | Freq: Four times a day (QID) | RESPIRATORY_TRACT | Status: DC | PRN

## 2012-06-03 MED ORDER — ALBUTEROL SULFATE (5 MG/ML) 0.5% IN NEBU
2.5000 mg | INHALATION_SOLUTION | Freq: Three times a day (TID) | RESPIRATORY_TRACT | Status: DC
  Administered 2012-06-04: 2.5 mg via RESPIRATORY_TRACT
  Filled 2012-06-03: qty 0.5

## 2012-06-03 MED ORDER — ALBUTEROL SULFATE (5 MG/ML) 0.5% IN NEBU
INHALATION_SOLUTION | RESPIRATORY_TRACT | Status: AC
  Filled 2012-06-03: qty 0.5

## 2012-06-03 MED ORDER — ENSURE COMPLETE PO LIQD
237.0000 mL | Freq: Every day | ORAL | Status: DC
  Administered 2012-06-03: 237 mL via ORAL

## 2012-06-03 MED ORDER — FERROUS SULFATE 325 (65 FE) MG PO TABS
325.0000 mg | ORAL_TABLET | Freq: Three times a day (TID) | ORAL | Status: DC
  Administered 2012-06-03 – 2012-06-04 (×4): 325 mg via ORAL
  Filled 2012-06-03 (×6): qty 1

## 2012-06-03 MED ORDER — CIPROFLOXACIN HCL 500 MG PO TABS
500.0000 mg | ORAL_TABLET | ORAL | Status: DC
  Administered 2012-06-03 – 2012-06-04 (×2): 500 mg via ORAL
  Filled 2012-06-03 (×4): qty 1

## 2012-06-03 MED ORDER — BISACODYL 5 MG PO TBEC
5.0000 mg | DELAYED_RELEASE_TABLET | Freq: Every day | ORAL | Status: DC | PRN
  Administered 2012-06-03: 10 mg via ORAL
  Filled 2012-06-03: qty 2

## 2012-06-03 MED ORDER — WARFARIN SODIUM 2 MG PO TABS
2.0000 mg | ORAL_TABLET | Freq: Once | ORAL | Status: AC
  Administered 2012-06-03: 2 mg via ORAL
  Filled 2012-06-03: qty 1

## 2012-06-03 MED ORDER — POTASSIUM CHLORIDE CRYS ER 20 MEQ PO TBCR
40.0000 meq | EXTENDED_RELEASE_TABLET | Freq: Once | ORAL | Status: AC
  Administered 2012-06-03: 40 meq via ORAL
  Filled 2012-06-03: qty 2

## 2012-06-03 NOTE — Progress Notes (Signed)
D: 78yo WF pt Dr. Montez Morita under [facility] now POD#4 s/p Lt tibia ORIF and IMN with closed Lt fibula reduction. Pt with h/o MVP, heart murmur, arthritis, dementia, and stroke x2 in the past. Pt noted to have shallow, rapid respirations with accessory muscle use, with diminished breath sounds and some exp wheeze on assessment at 1920.  Pt's VS rechecked at 1949 and pt also noted to have elevated HR at 120 and decreased oxygen saturation at 91% on ra, and resp rate at 36.   A: Chart reviewed - previous VS, progress notes, CXR reports, etc viewed. MD paged with concerns. Repeat VS showed HR 106, resp 22 and oxygen sats 91 on RA. Return call received at 2040. Requested orders for repeat stat CXR and resp therapy treatment now and prn. Orders received. MD states that he will be in shortly to see pt on rounds.  R: Resp therapy at bedside at 2055 to eval and treat. CXR completed at 2108. At 2122 pt with less accessory muscle involvement with respirations.  CXR results viewed at 2134.  Dr. Montez Morita at bedside at 2135. Will await further MD instructions and continue monitor pt status. Marrion Coy, RN

## 2012-06-03 NOTE — Progress Notes (Signed)
SNF bed available at [facility] ) tomorrow if pt is stable for d/c. CSW will assist with d/c planning to SNF. FL2 has been signed.  Cori Razor LCSW [phone]

## 2012-06-03 NOTE — Progress Notes (Signed)
Subjective: 3 Days Post-Op Procedure(s) (LRB): INTRAMEDULLARY (IM) NAIL TIBIAL (Left) Patient reports pain as 2 on 0-10 scale.    Objective: Vital signs in last 24 hours: Temp:  [97.4 F (36.3 C)-99.1 F (37.3 C)] 97.4 F (36.3 C) (08/19 1949) Pulse Rate:  [79-120] 106  (08/19 2019) Resp:  [16-36] 22  (08/19 2019) BP: (136-151)/(63-72) 151/72 mmHg (08/19 1949) SpO2:  [91 %-93 %] 91 % (08/19 2019)  Intake/Output from previous day: 08/18 0701 - 08/19 0700 In: 1236.9 [P.O.:480; I.V.:146.9; Blood:610] Out: -  Intake/Output this shift: Total I/O In: 120 [P.O.:120] Out: -    Basename 06/03/12 0129 06/02/12 0433 06/01/12 0517  HGB 9.7* 7.3* 9.3*    Basename 06/03/12 0129 06/02/12 0433  WBC 8.2 7.0  RBC 3.09* 2.34*  HCT 27.7* 22.4*  PLT 178 199    Basename 06/03/12 0129 06/02/12 0433  NA 134* 133*  K 3.4* 3.3*  CL 100 100  CO2 27 27  BUN 13 11  CREATININE 0.54 0.61  GLUCOSE 112* 127*  CALCIUM 8.1* 8.0*    Basename 06/03/12 0129 06/02/12 0433  LABPT -- --  INR 1.38 1.57*    some increase in respiratory rate ,responded to breathing treatment, chest x-r no acute changes. plan discharge in am.  Assessment/Plan: 3 Days Post-Op Procedure(s) (LRB): INTRAMEDULLARY (IM) NAIL TIBIAL (Left) Discharge to SNF  Teodoro Jeffreys F 06/03/2012, 9:39 PM

## 2012-06-03 NOTE — Progress Notes (Signed)
Physical Therapy Treatment Patient Details Name: Brittany [name] MRN: [id number] DOB: 1924/09/09 Today's Date: 06/03/2012 Time: 1130-1150 PT Time Calculation (min): 20 min  PT Assessment / Plan / Recommendation Comments on Treatment Session  Pt appears more awake and conversive with daughter.  Pt required + 2 total assist to sit to EOB as pt was resistant and a bit combative/pinching/hitting.  Allowed pt increased time to sit on EOB to calm down. Assisted pt to recliner stand pivot sit 1/4 turn to her R while maintainiong NWB thru L LE(KI).  Rec nursing staff use lift to assist pt back to bed.    Follow Up Recommendations  Skilled nursing facility    Barriers to Discharge        Equipment Recommendations  Defer to next venue    Recommendations for Other Services    Frequency Min 3X/week   Plan Discharge plan remains appropriate    Precautions / Restrictions Precautions Precautions: Fall Precaution Comments: awaiting official WB status from Ortho MD Required Braces or Orthoses: Knee Immobilizer - Left Knee Immobilizer - Left: Other (comment) (No official MD order but currently on)    Pertinent Vitals/Pain With activity, pt expresses pain however unable to rate.    Mobility  Bed Mobility Bed Mobility: Supine to Sit Supine to Sit: 1: +2 Total assist Supine to Sit: Patient Percentage: 10% Details for Bed Mobility Assistance: MAX VC's for direction and to decrease fear/yelling out/cofusion/orientation.   Transfers Transfers: Editor, commissioning Transfers: 1: +2 Total assist Stand Pivot Transfers: Patient Percentage: 0% Details for Transfer Assistance: "Redmond Pulling" stand pivot 1/4 turn to pt's R from elevated bed to recliner with 2nd assist behind guiding hips.  Pt screaming with pain/fear/anxiety/hx dementia.  Ambulation/Gait Ambulation/Gait Assistance: Not tested (comment) (Pt non amb)    Exercises  unable to perform 2nd increased level of aggitation    PT  Goals                                       progressing    Visit Information  Last PT Received On: 06/03/12 Assistance Needed: +2    Subjective Data  Subjective: I don't need all these people around me!! Patient Stated Goal: n/a   Cognition    impaired   Balance   zero  End of Session PT - End of Session Equipment Utilized During Treatment: Gait belt Activity Tolerance: Patient limited by pain;Treatment limited secondary to agitation Patient left: in chair;with call bell/phone within reach;with family/visitor present Nurse Communication: Need for lift equipment (Maxi move pad in chair under pt)   Felecia Shelling  PTA [facility]  Acute  Rehab Pager     [phone]

## 2012-06-03 NOTE — Progress Notes (Signed)
Physical Therapy Treatment Patient Details Name: Brittany [name] MRN: [id number] DOB: [birth year] Today's Date: 06/03/2012 Time: 4098-1191 PT Time Calculation (min): 20 min  PT Assessment / Plan / Recommendation Comments on Treatment Session  With NT, assisted pt back to bed via Maxi Move then bed mobility for a bath and hygiene as pt is incont.  Extra time for positioning to comfort. Daughter present during PM session and aslo assisted by comforting pt.    Follow Up Recommendations  Skilled nursing facility    Barriers to Discharge        Equipment Recommendations  Defer to next venue    Recommendations for Other Services    Frequency Min 3X/week   Plan Discharge plan remains appropriate    Precautions / Restrictions Precautions Precautions: Fall Precaution Comments: awaiting official WB status from Ortho MD Required Braces or Orthoses: Knee Immobilizer - Left Knee Immobilizer - Left: Other (comment) (No official MD order but currently on)    Pertinent Vitals/Pain C/o pain during activity    Mobility  Bed Mobility Bed Mobility: Rolling Left;Rolling Right Rolling Right: 1: +2 Total assist Rolling Right: Patient Percentage: 10% Rolling Left: 1: +2 Total assist Rolling Left: Patient Percentage: 10% Supine to Sit: 1: +2 Total assist Supine to Sit: Patient Percentage: 10% Details for Bed Mobility Assistance: side to side rolling for hygiene and Maxi Move pad removal  Transfers Transfers: Not assessed Stand Pivot Transfers: 1: +2 Total assist Stand Pivot Transfers: Patient Percentage: 0% Transfer via Lift Equipment: Maximove Details for Transfer Assistance: + 2 assist Maxi Move pt from recliner back to bed  Ambulation/Gait Ambulation/Gait Assistance: Not tested (comment)     PT Goals    Visit Information  Last PT Received On: 06/03/12 Assistance Needed: +2    Subjective Data  Subjective: I don't need all these people around me!! Patient Stated Goal: n/a     Cognition    impaired   Balance   zero  End of Session PT - End of Session Equipment Utilized During Treatment: Gait belt Activity Tolerance: Treatment limited secondary to agitation Patient left: in bed;with call bell/phone within reach;with family/visitor present Nurse Communication: Need for lift equipment (Maxci move pad in chair under pt)   Felecia Shelling  PTA [facility]  Acute  Rehab Pager     [phone]

## 2012-06-03 NOTE — Progress Notes (Signed)
ANTICOAGULATION CONSULT NOTE - Follow Up Consult  Pharmacy Consult for Warfarin Indication: VTE Prophylaxis  Allergies  Allergen Reactions  . Penicillins Hives    Patient Measurements: Height: 5\' 4"  (162.6 cm) Weight: 125 lb (56.7 kg) IBW/kg (Calculated) : 54.7   Vital Signs: Temp: 99.1 F (37.3 C) (08/19 0627) Temp src: Axillary (08/19 0627) BP: 150/63 mmHg (08/19 0627) Pulse Rate: 79  (08/19 0627)  Labs:  Basename 06/03/12 0129 06/02/12 0433 06/01/12 0517  HGB 9.7* 7.3* --  HCT 27.7* 22.4* 28.9*  PLT 178 199 223  APTT -- -- --  LABPROT 17.2* 19.1* --  INR 1.38 1.57* --  HEPARINUNFRC -- -- --  CREATININE 0.54 0.61 0.58  CKTOTAL -- -- --  CKMB -- -- --  TROPONINI -- -- --    Estimated Creatinine Clearance: 43.6 ml/min (by C-G formula based on Cr of 0.54).   Medications:  Scheduled:     . acetaminophen  650 mg Oral Daily  . cholecalciferol  2,000 Units Oral Daily  . ciprofloxacin  500 mg Oral Custom  . ferrous sulfate  325 mg Oral TID WC  . memantine  10 mg Oral BID  . potassium chloride  40 mEq Oral Once  . risperiDONE  0.5 mg Oral QHS  . rivastigmine  9.5 mg Transdermal Daily  . sertraline  75 mg Oral Daily  . vitamin B-12  100 mcg Oral Daily  . warfarin  1 mg Oral ONCE-1800  . warfarin   Does not apply Once  . Warfarin - Pharmacist Dosing Inpatient   Does not apply q1800  . DISCONTD: ciprofloxacin  400 mg Intravenous Q12H  . DISCONTD: ferrous sulfate  325 mg Oral BID PC   Infusions:    Assessment: 78 year old female with dementia s/p ORIF left tibia/fibula fracture on 8/16. Warfarin started 8/17am for VTE prophylaxis, using lower dose due to patient age and wt ~ 25kg. INR jumped from 1.1--> 1.57 after warfarin 4mg  x1 (unexpectedly quick rise; drug interaction with Cipro could be contributing), coumadin 1mg  last pm and INR decreased (suspect from PRBC).  No bleeding reported in chart notes; H/H better, transfused 2 units PRBC 8/18.   Goal of Therapy:   INR 2-3   Plan:  1) Warfarin 2mg  PO x1 at 18:00. 2) Daily PT/INR 3) Warfarin education to be completed prior to discharge if able to with hx of dementia.  Juliette Alcide, PharmD, BCPS.   Pager: [phone] 06/03/2012,2:06 PM

## 2012-06-03 NOTE — Progress Notes (Signed)
Nutrition Follow Up  Diet:  Dysphagia 3 thin with very good po per daughter.  Pt with dementia receiving house diet.  Intake 100%.  Loves ice cream.  Will send with meals.  Protein needs are high.  Will add Ensure Complete once daily to supplement.  Oran Rein, RD [phone]

## 2012-06-03 NOTE — Progress Notes (Signed)
[facility] PROGRESS NOTE/CONSULT NOTE  REKHA HOBBINS GNF:[id number] DOB: 76-Feb-1925 DOA: 05/30/2012 PCP: No primary provider on file.  Assessment/Plan: Principal Problem:  *Fracture of tibia with fibula, left, closed Active Problems:  Knee effusion, left  Generalized pain  Hypokalemia  Dementia  Iron deficiency anemia  UTI (lower urinary tract infection)  Elevated BP  Acute blood loss anemia  1 Enterobacter clocae urinary tract infection  Change IV Cipro to oral ciprofloxacin. Antibiotic day #3/7 #2 dementia  Continue home regimen of Namenda, risperdal, Exelon patch, Zoloft.  #3 history of iron deficiency anemia/acute blood loss anemia Patient's hemoglobin today 9.7 from 7.3 from 9.3. No overt GI bleed. Likely acute blood loss anemia secondary to surgery. S/P 2 units of packed red blood cells. Follow H&H. Increase home regimen of iron sulfate.  #4 spiral fracture of the left tibia and fibula   s/p ORIF and IM nail to L tibia.  Per primary team.  #5 elevated blood pressure Secondary to pain. Improved with pain management. Follow. #6 prophylaxis  Will defer to primary team.   Code Status: Full Family Communication: Updated daughter at bedside Disposition Plan: SNF. Per primary team   Brief narrative: Benelli Winther is a 78 year old Caucasian female nursing home resident mainly wheelchair bound since November of 2012 with a history of dementia, mitral valve prolapse, iron deficiency anemia, prior history of mini strokes discovered incidentally on CT scan per patient's daughter, history of panic attacks, history of osteoarthritis who presented to the ED with left lower extremity pain. Patient is demented and a such was unable to obtain a history. History was obtained from the chart and per patient's daughter. Patient's daughter states that she was called from the nursing home that 2 CNA's were placing her mother in bed and the mother subsequently screamed and said her leg was  broken. Patient was subsequently transferred to the ED.plain films of the left tibia and fibula show a spiral fractures of the shaft of the tibia and fibula. Patient's daughter states that patient does not have a history of coronary artery disease, patient has not had any history of an acute MI, patient has not had any fevers, no chills, no nausea, no vomiting, no abdominal pain, no dysuria, no weakness,. Patient's daughter does endorse a mother has a cough. No other associated symptoms. Patient was admitted to the orthopedic service and will call for preoperative clearance.    Consultants:    Procedures:  ORIF and IM nail L tibia 05/31/12  Antibiotics:  IV Cipro 06/01/12---> 06/03/12  Oral cipro 06/03/12  HPI/Subjective: Patient agitated. Patient complaining of pain in left lower extremity. No overt GI bleed.  Objective: Filed Vitals:   06/02/12 1706 06/02/12 1806 06/02/12 2115 06/03/12 0627  BP: 154/61 116/57 128/60 150/63  Pulse: 86 91 83 79  Temp: 99.2 F (37.3 C) 98.3 F (36.8 C) 99.5 F (37.5 C) 99.1 F (37.3 C)  TempSrc: Oral Oral Axillary Axillary  Resp: 16 16 17 16   Height:      Weight:      SpO2:   94% 93%    Intake/Output Summary (Last 24 hours) at 06/03/12 1146 Last data filed at 06/02/12 1806  Gross per 24 hour  Intake 1070.7 ml  Output      0 ml  Net 1070.7 ml   Filed Weights   05/31/12 0623 05/31/12 1504  Weight: 56.7 kg (125 lb) 56.7 kg (125 lb)    Exam: General: Alert, awake,Agitated.  HEENT: No bruits, no goiter.  Heart: Regular rate and rhythm, without murmurs, rubs, gallops. Lungs: Clear to auscultation bilaterally. Abdomen: Soft, nontender, nondistended, positive bowel sounds. Extremities: No clubbing cyanosis or edema with positive pedal pulses.LLE in immobilizer Neuro: Grossly intact, nonfocal.  Data Reviewed: Basic Metabolic Panel:  Lab 06/03/12 4540 06/02/12 0926 06/02/12 0433 06/01/12 0517 05/31/12 0023  NA 134* -- 133* 132* 140  K  3.4* -- 3.3* 4.6 4.1  CL 100 -- 100 98 105  CO2 27 -- 27 26 --  GLUCOSE 112* -- 127* 115* 106*  BUN 13 -- 11 10 22   CREATININE 0.54 -- 0.61 0.58 0.70  CALCIUM 8.1* -- 8.0* 8.6 --  MG -- 1.8 -- -- --  PHOS -- -- -- -- --   Liver Function Tests: No results found for this basename: AST:5,ALT:5,ALKPHOS:5,BILITOT:5,PROT:5,ALBUMIN:5 in the last 168 hours No results found for this basename: LIPASE:5,AMYLASE:5 in the last 168 hours No results found for this basename: AMMONIA:5 in the last 168 hours CBC:  Lab 06/03/12 0129 06/02/12 0433 06/01/12 0517 05/31/12 0023 05/30/12 2359  WBC 8.2 7.0 12.1* -- 9.7  NEUTROABS -- -- -- -- --  HGB 9.7* 7.3* 9.3* 9.9* 10.1*  HCT 27.7* 22.4* 28.9* 29.0* 31.5*  MCV 89.6 94.9 96.7 -- 95.5  PLT 178 199 223 -- 220   Cardiac Enzymes: No results found for this basename: CKTOTAL:5,CKMB:5,CKMBINDEX:5,TROPONINI:5 in the last 168 hours BNP (last 3 results) No results found for this basename: PROBNP:3 in the last 8760 hours CBG: No results found for this basename: GLUCAP:5 in the last 168 hours  Recent Results (from the past 240 hour(s))  URINE CULTURE     Status: Normal   Collection Time   05/31/12  2:29 AM      Component Value Range Status Comment   Specimen Description URINE, CATHETERIZED   Final    Special Requests NONE   Final    Culture  Setup Time 05/31/2012 18:12   Final    Colony Count >=100,000 COLONIES/ML   Final    Culture ENTEROBACTER CLOACAE   Final    Report Status 06/02/2012 FINAL   Final    Organism ID, Bacteria ENTEROBACTER CLOACAE   Final      Studies: Dg Chest 1 View  05/31/2012  *RADIOLOGY REPORT*  Clinical Data: Leg injury.  CHEST - 1 VIEW  Comparison: 09/06/2011.  Findings: The patient is rotated to the left.  Pulmonary vascular congestion.  Cardiopericardial silhouette appears within normal limits.  Aortic atherosclerosis. There is no airspace disease identified.  Left basilar atelectasis. Healed right posterior rib fracture.   Osteopenia.  Healed proximal left humerus fracture.  IMPRESSION: No acute cardiopulmonary disease.  Chronic changes of the chest. Healed rib and left proximal humerus fractures.  Original Report Authenticated By: Andreas Newport, M.D.   Dg Hip Complete Left  05/31/2012  *RADIOLOGY REPORT*  Clinical Data: Leg pain.  LEFT HIP - COMPLETE 2+ VIEW  Comparison: None.  Findings: Severe osteopenia is present.  The obturator rings grossly appear intact.  Both hips are externally rotated.  No grossly displaced fracture of the femoral necks.  Consider obtaining repeat examination with external rotation of the hips if there is concern for hip fracture.  On the dedicated views of the left hip, there is no fracture plane identified and the trabecular markings appear within normal limits.  Extensive atherosclerotic calcification.  Technically poor the lateral view.  The hip grossly appears located. If there is a high clinical suspicion of hip fracture (patient refuses to  bear weight), MRI is preferred in cases of radiographically occult hip fracture, particularly in osteopenic patients.  While CT is expeditious, evidence is lacking regarding accuracy over radiography.  IMPRESSION: No grossly displaced fracture.  Marked osteopenia.  Original Report Authenticated By: Andreas Newport, M.D.   Dg Tibia/fibula Left  05/31/2012  *RADIOLOGY REPORT*  Clinical Data: Left tibia IM rod.  LEFT TIBIA AND FIBULA - 2 VIEW  Comparison: 05/31/2012  Findings: Seven intraoperative spot images demonstrate placement of IM rod across the left tibial fracture.  Near anatomic alignment on these spot images.  The fibular fracture is not well visualized.  IMPRESSION: Internal fixation left tibial fracture.  Original Report Authenticated By: Cyndie Chime, M.D.   Dg Tibia/fibula Left  05/31/2012  *RADIOLOGY REPORT*  Clinical Data: Leg pain.  LEFT TIBIA AND FIBULA - 2 VIEW  Comparison: None.  Findings: Spiral fracture of the mid shaft of the left  tibia is present.  There is one shaft lateral displacement of the tibia with apex dorsal angulation.  The proximal fibular diaphysis also shows a spiral fracture with one shaft lateral displacement.  There is apex dorsal angulation of the fibular fracture with one shaft width anterior displacement of the distal fibula.  Visualized hind foot appears within normal limits with a K-wire fixation in the metatarsals. Partial visualization of the left knee demonstrate severe osteoarthritis.  IMPRESSION: Spiral fractures of the shaft of the tibia and fibula.  Diffuse osteopenia.  Original Report Authenticated By: Andreas Newport, M.D.   Dg C-arm 1-60 Min-no Report  05/31/2012  CLINICAL DATA: surgery   C-ARM 1-60 MINUTES  Fluoroscopy was utilized by the requesting physician.  No radiographic  interpretation.      Scheduled Meds:    . acetaminophen  650 mg Oral Daily  . cholecalciferol  2,000 Units Oral Daily  . ciprofloxacin  500 mg Oral Custom  . ferrous sulfate  325 mg Oral TID WC  . memantine  10 mg Oral BID  . potassium chloride  40 mEq Oral Once  . risperiDONE  0.5 mg Oral QHS  . rivastigmine  9.5 mg Transdermal Daily  . sertraline  75 mg Oral Daily  . vitamin B-12  100 mcg Oral Daily  . warfarin  1 mg Oral ONCE-1800  . warfarin   Does not apply Once  . Warfarin - Pharmacist Dosing Inpatient   Does not apply q1800  . DISCONTD: ciprofloxacin  400 mg Intravenous Q12H  . DISCONTD: ferrous sulfate  325 mg Oral BID PC   Continuous Infusions:    Principal Problem:  *Fracture of tibia with fibula, left, closed Active Problems:  Knee effusion, left  Generalized pain  Hypokalemia  Dementia  Iron deficiency anemia  UTI (lower urinary tract infection)  Elevated BP  Acute blood loss anemia    Time spent: > 30 mins    [facility]  [facility] Pager [phone]. If 8PM-8AM, please contact night-coverage at [email or web address], password [facility] 06/03/2012, 11:46 AM  LOS: 4 days

## 2012-06-04 LAB — BASIC METABOLIC PANEL
BUN: 19 mg/dL (ref 6–23)
Creatinine, Ser: 0.6 mg/dL (ref 0.50–1.10)
GFR calc Af Amer: >90 mL/min (ref >90)
GFR calc non Af Amer: 80 mL/min — ABNORMAL LOW (ref >90)
Potassium: 4 mEq/L (ref 3.5–5.1)

## 2012-06-04 LAB — CBC
HCT: 29.5 % — ABNORMAL LOW (ref 36.0–46.0)
MCHC: 32.9 g/dL (ref 30.0–36.0)
Platelets: 245 10*3/uL (ref 150–400)
RDW: 16.2 % — ABNORMAL HIGH (ref 11.5–15.5)
WBC: 8.6 10*3/uL (ref 4.0–10.5)

## 2012-06-04 LAB — PROTIME-INR: INR: 1.5 — ABNORMAL HIGH (ref 0.00–1.49)

## 2012-06-04 MED ORDER — OXYCODONE-ACETAMINOPHEN 5-325 MG PO TABS: 1.0000 | ORAL_TABLET | ORAL | Status: AC | PRN

## 2012-06-04 MED ORDER — WARFARIN - PHARMACIST DOSING INPATIENT: 5.0000 | Freq: Every day | Status: AC

## 2012-06-04 MED ORDER — DSS 100 MG PO CAPS: 100.0000 mg | ORAL_CAPSULE | Freq: Two times a day (BID) | ORAL | Status: AC | PRN

## 2012-06-04 NOTE — Care Management Note (Signed)
    Page 1 of 2   06/04/2012     3:24:06 PM   CARE MANAGEMENT NOTE 06/04/2012  Patient:  Brittany [name], Brittany [name]   Account Number:  [id number]  Date Initiated:  06/03/2012  Documentation initiated by:  Colleen Can  Subjective/Objective Assessment:   DX tib/fib fracture; ORIF anmd IM nail of left tib/fib fracture     Action/Plan:   SNF rehab; pt is from SNF   Anticipated DC Date:  06/04/2012   Anticipated DC Plan:  SKILLED NURSING FACILITY  In-house referral  Clinical Social Worker      DC Planning Services  NA      [facility] Choice  NA   Choice offered to / List presented to:  NA   DME arranged  NA      DME agency  NA     HH arranged  NA      HH agency  NA   Status of service:  Completed, signed off Medicare Important Message given?  NO (If response is "NO", the following Medicare IM given date fields will be blank) Date Medicare IM given:   Date Additional Medicare IM given:    Discharge Disposition:  SKILLED NURSING FACILITY  Per UR Regulation:    If discussed at Long Length of Stay Meetings, dates discussed:    Comments:

## 2012-06-04 NOTE — Discharge Summary (Signed)
Physician Discharge Summary  Patient ID: Brittany [name] MRN: [id number] DOB/AGE: [birth year] 78 y.o.  Admit date: 05/30/2012 Discharge date: 06/04/2012  Admission Diagnoses:  Discharge Diagnoses:  Principal Problem:  *Fracture of tibia with fibula, left, closed Active Problems:  Knee effusion, left  Generalized pain  Hypokalemia  Dementia  Iron deficiency anemia  UTI (lower urinary tract infection)  Elevated BP  Acute blood loss anemia   Discharged Condition: good  Hospital Course: PATIENT REMAINED STABLE  DURING ENTIRE POST-OP PERIOD, RECEIVED 2 UNITS OF PACKED CELLS, H&H IS STABLE , PAIN IS UNDER CONTROL WITH PERCOCET Q 6HR PRN, PT FOR OUT OF BED AND WBAT TO LEFT LEG.  Consults: None  Significant Diagnostic Studies: labs: CMET, CBC,EKG, AND CHEST X-R REMAINED STABLE. UA REVEALED UTI WAS TREATED WITH CIPRO 500MG   Treatments: anticoagulation: warfarin  Discharge Exam: Blood pressure 167/69, pulse 86, temperature 98.4 F (36.9 C), temperature source Axillary, resp. rate 28, height 5\' 4"  (1.626 m), weight 56.7 kg (125 lb), SpO2 93.00%. Incision/Wound:HEALING WELL NO SIGN OF INFECTION.  Disposition: 03-Skilled Nursing Facility  Discharge Orders    Future Orders Please Complete By Expires   Diet - low sodium heart healthy      Increase activity slowly      Discharge instructions      Comments:   Return to office in one week, wbat left leg.     Medication List  As of 06/04/2012  9:51 AM   STOP taking these medications         cephALEXin 250 MG capsule         TAKE these medications         acetaminophen 500 MG tablet   Commonly known as: TYLENOL   Take 500 mg by mouth every 6 (six) hours as needed. pain      acetaminophen 325 MG tablet   Commonly known as: TYLENOL   Take 650 mg by mouth daily.      cholecalciferol 1000 UNITS tablet   Commonly known as: VITAMIN D   Take 2,000 Units by mouth daily.      DSS 100 MG Caps   Take 100 mg by mouth 2 (two) times  daily as needed.      ferrous sulfate 325 (65 FE) MG tablet   Take 325 mg by mouth 2 (two) times daily.      haloperidol lactate 5 MG/ML injection   Commonly known as: HALDOL   Inject 2 mg into the muscle every 6 (six) hours as needed. For agitation      memantine 10 MG tablet   Commonly known as: NAMENDA   Take 10 mg by mouth 2 (two) times daily.      oxyCODONE-acetaminophen 5-325 MG per tablet   Commonly known as: PERCOCET/ROXICET   Take 1 tablet by mouth every 4 (four) hours as needed.      risperiDONE 0.5 MG tablet   Commonly known as: RISPERDAL   Take 0.5 mg by mouth at bedtime.      risperiDONE 0.5 MG tablet   Commonly known as: RISPERDAL   Take 0.5 mg by mouth 3 (three) times daily as needed.      rivastigmine 9.5 mg/24hr   Commonly known as: EXELON   Place 1 patch onto the skin daily. Remove old patch before placing new patch.      sertraline 25 MG tablet   Commonly known as: ZOLOFT   Take 75 mg by mouth daily.  vitamin B-12 100 MCG tablet   Commonly known as: CYANOCOBALAMIN   Take 100 mcg by mouth daily.      Warfarin - Pharmacist Dosing Inpatient Misc   5 each by Does not apply route daily at 6 PM.             SignedMyrtie Neither F 06/04/2012, 9:51 AM

## 2012-06-04 NOTE — Discharge Summary (Signed)
Brittany [name], FAITH NO.:  [id number]  MEDICAL RECORD NO.:  [id number]  LOCATION:  1612                         FACILITY:  [facility]  PHYSICIAN:  Myrtie Neither, MD      DATE OF BIRTH:  09-04-1924  DATE OF ADMISSION:  05/30/2012 DATE OF DISCHARGE:  06/04/2012                              DISCHARGE SUMMARY   ADMITTING DIAGNOSES: 1. Fracture of the left tibia and fibula. 2. Senile dementia. 3. Hypokalemia. 4. Chronic iron deficiency. 5. Urinary tract infection. 6. High blood pressure.  DISCHARGE DIAGNOSES: 1. Fractured left tibia and fibula. 2. Urinary tract infection. 3. Postoperative acute blood loss anemia. 4. High blood pressure.  COMPLICATIONS:  None.  INFECTIONS:  Urinary tract infection.  OPERATIONS:  ORIF, left tibia.  PERTINENT HISTORY:  This is an 78 year old female, who sustained a fall at the nursing home, brought to [facility] Emergency Room for treatment, and was found to have fractured left tibia and fibula.  HOSPITAL COURSE:  The patient was seen by inhouse hospitalist and found to be stable enough to undergo surgery.  The patient underwent ORIF, left tibia.  Tolerated the procedure quite well.  Postop course was fairly benign.  Did receive 2 units of packed cells due to acute blood loss anemia.  Start on physical therapy, out of bed, and weightbearing as tolerated with use of walker.  The patient is currently stable enough to be discharged to the nursing home facility and will be seen back in the office in 1 week.  MEDICATIONS:  The patient is being discharged on, 1. Tylenol. 2. Vitamin B. 3. Ferrous sulfate 325 mg t.i.d. 4. Haldol 5 mg. 5. Namenda 10 mg 2 tabs daily. 6. Risperdal 0.5 mg at bedtime. 7. Exelon 9.5 mg for 24 hours. 8. Zoloft 25 mg tablet, take 75 mg by mouth daily. 9. Vitamin B12 of 100 mg by mouth daily. 10.Percocet 5 mg 1 q.6 h. p.r.n. pain. 11.The patient is on Coumadin.  INR weekly, Coumadin per pharmacy.   Daily dressing changes to the left knee.  Return to the office in 1 week.  The patient is being discharged in stable and satisfactory condition.     Myrtie Neither, MD     AC/MEDQ  D:  06/04/2012  T:  06/04/2012  Job:  [id number]

## 2012-06-05 ENCOUNTER — Encounter (HOSPITAL_COMMUNITY): Payer: Self-pay | Admitting: Orthopedic Surgery

## 2012-06-05 NOTE — Progress Notes (Signed)
Pt was d/c to [facility] via [facility] transport on 06/04/12.  Asher Muir Camylle Whicker LCSW [phone]

## 2013-01-21 IMAGING — CR DG KNEE COMPLETE 4+V*L*
4 series · 4 of 4 positions shown · non-contrast
Comparison: None

CLINICAL DATA: Knee pain, altered mental status

LEFT KNEE - COMPLETE 4+ VIEW

[x knee ap left (1 of 4)]
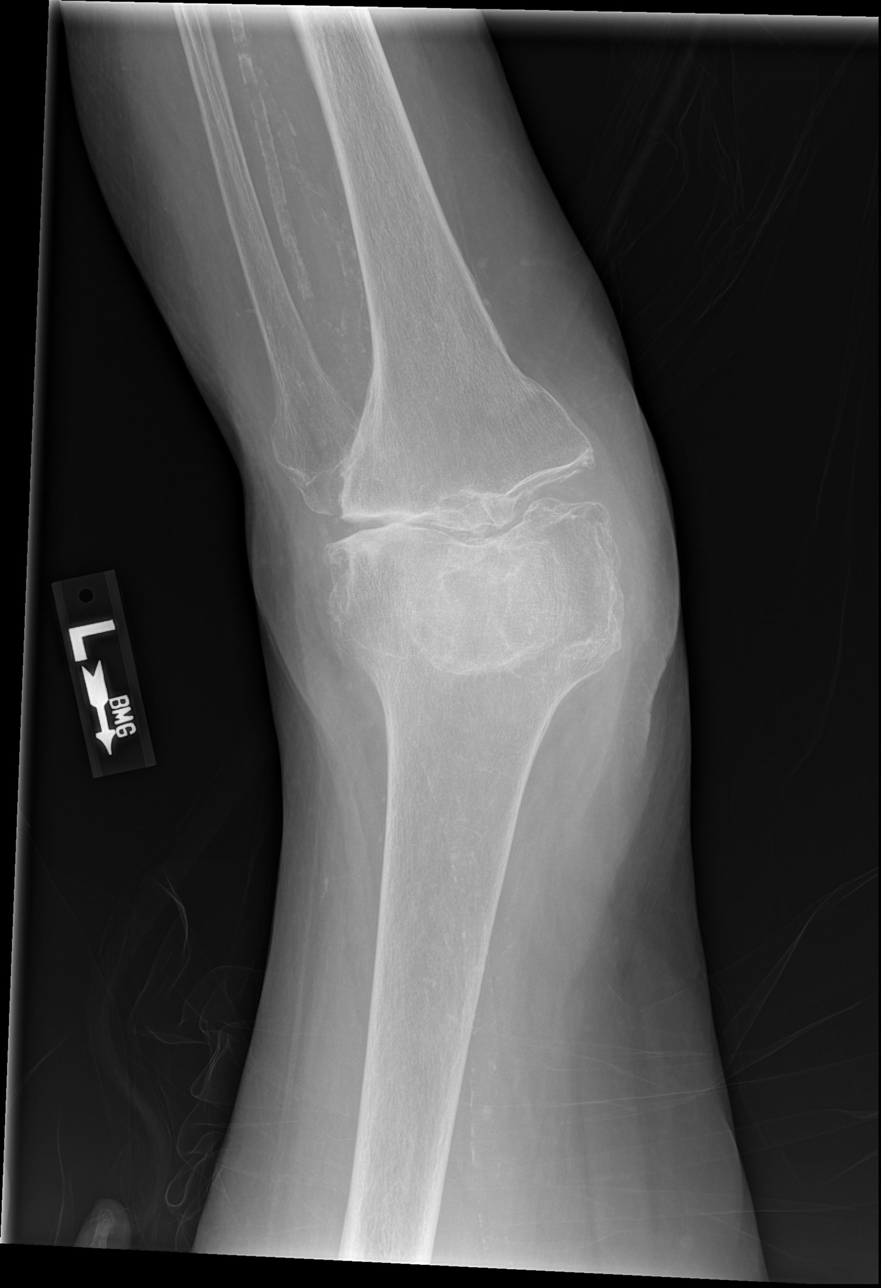

[x knee ap left (2 of 4)]
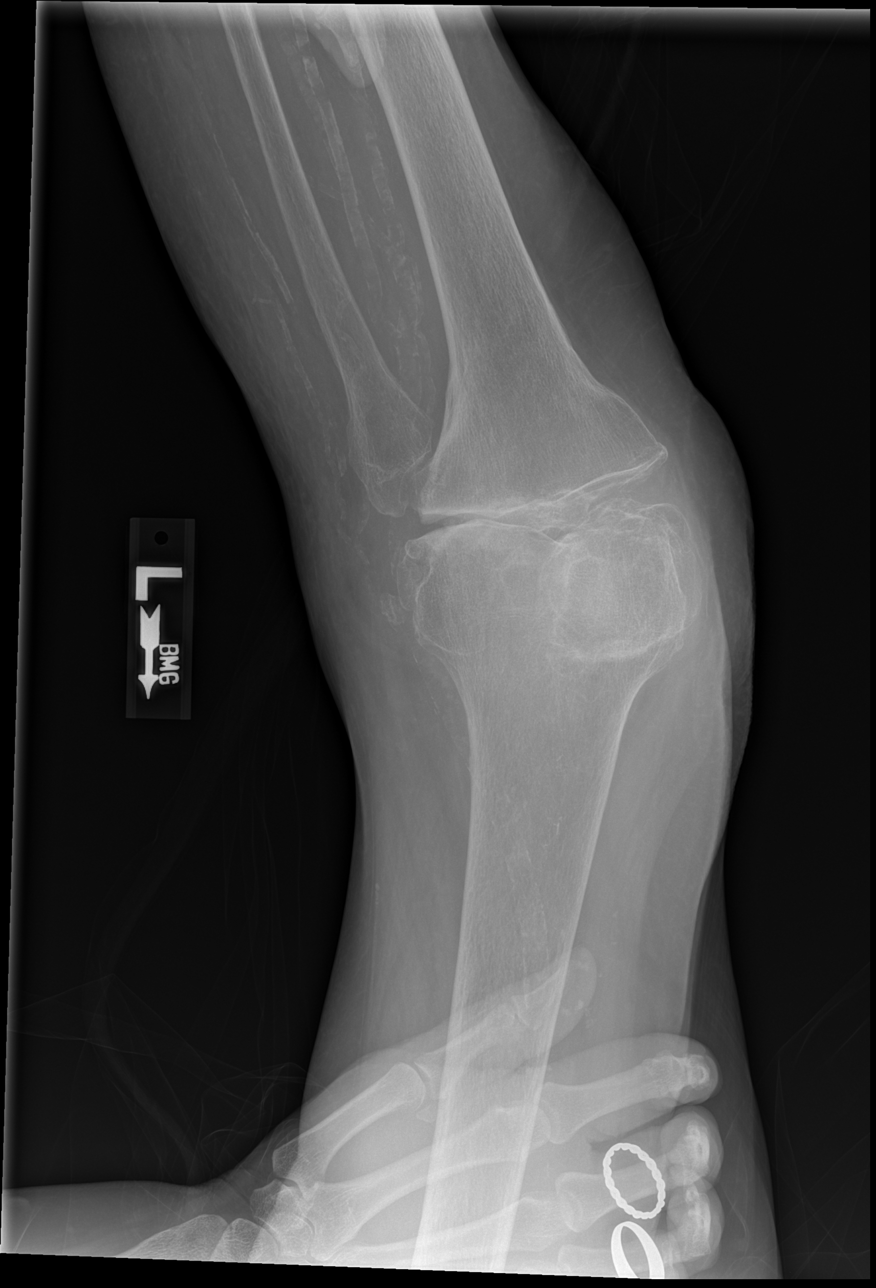

[x knee ap left (3 of 4)]
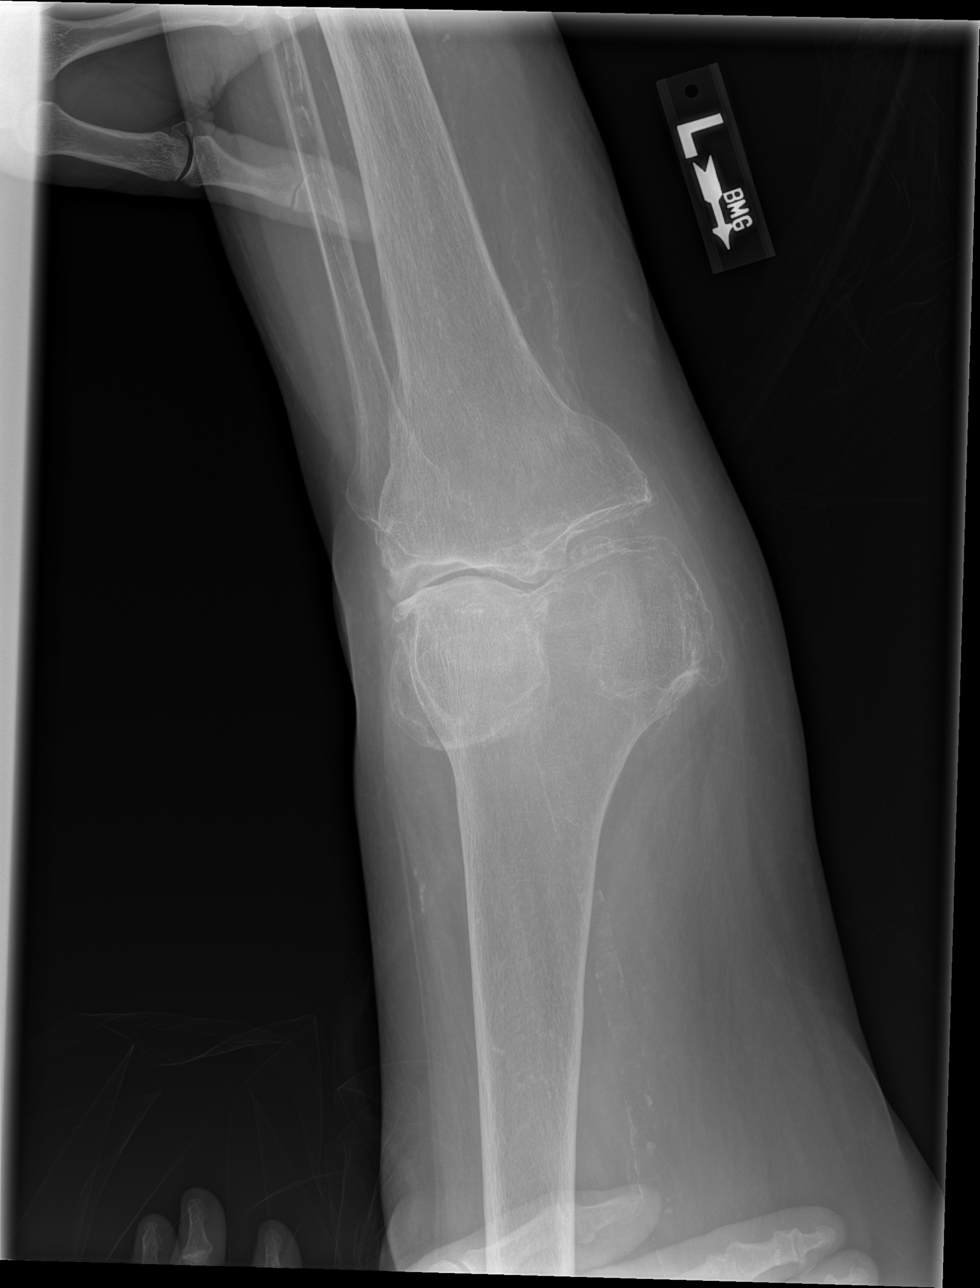

[x knee ap left (4 of 4)]
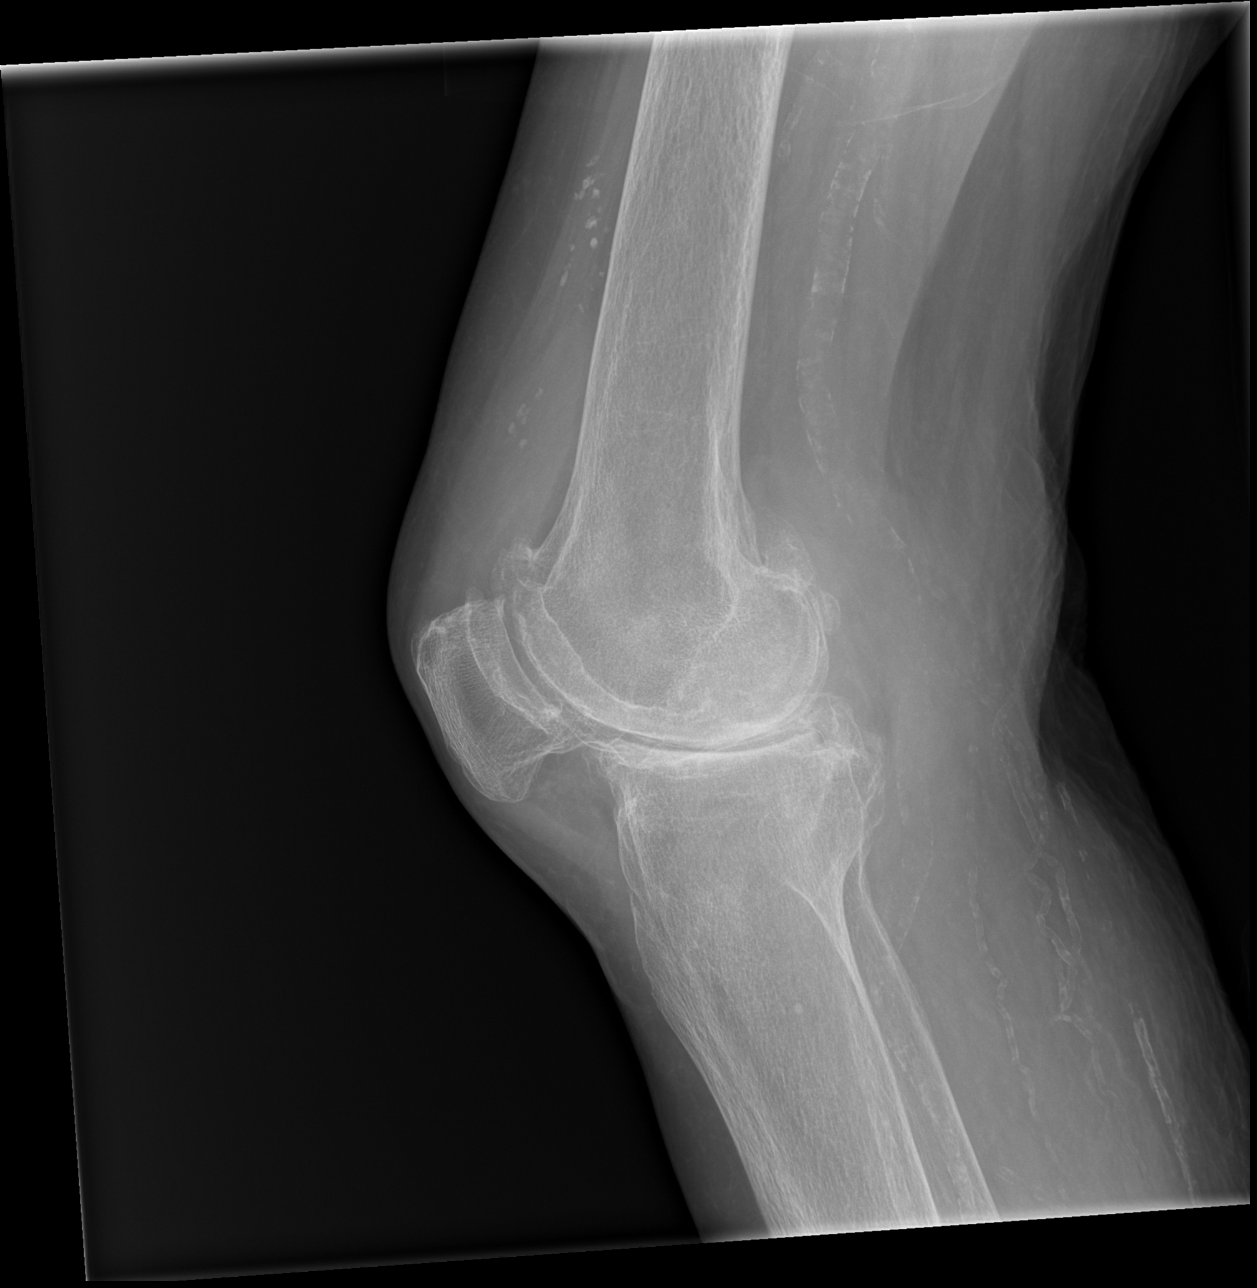

[4 of 4 positions shown; findings below may reference images not displayed]

FINDINGS: Patient uncooperative for imaging.
Extensive atherosclerotic calcification.
Tricompartmental osteoarthritic changes with joint space narrowing
and spur formation.
No acute fracture, dislocation or bone destruction.
Question knee joint effusion.
IMPRESSION: Osseous demineralization with advanced tricompartmental
osteoarthritic changes and suspected small knee joint effusion.
No definite acute bony abnormalities.
Extensive atherosclerotic calcification.

## 2013-01-21 IMAGING — CR DG CHEST 1V
1 series · 1 of 1 positions shown · non-contrast
Comparison: 02/03/2011

CLINICAL DATA: Fall and altered mental status.

CHEST - 1 VIEW

[x chest ap]
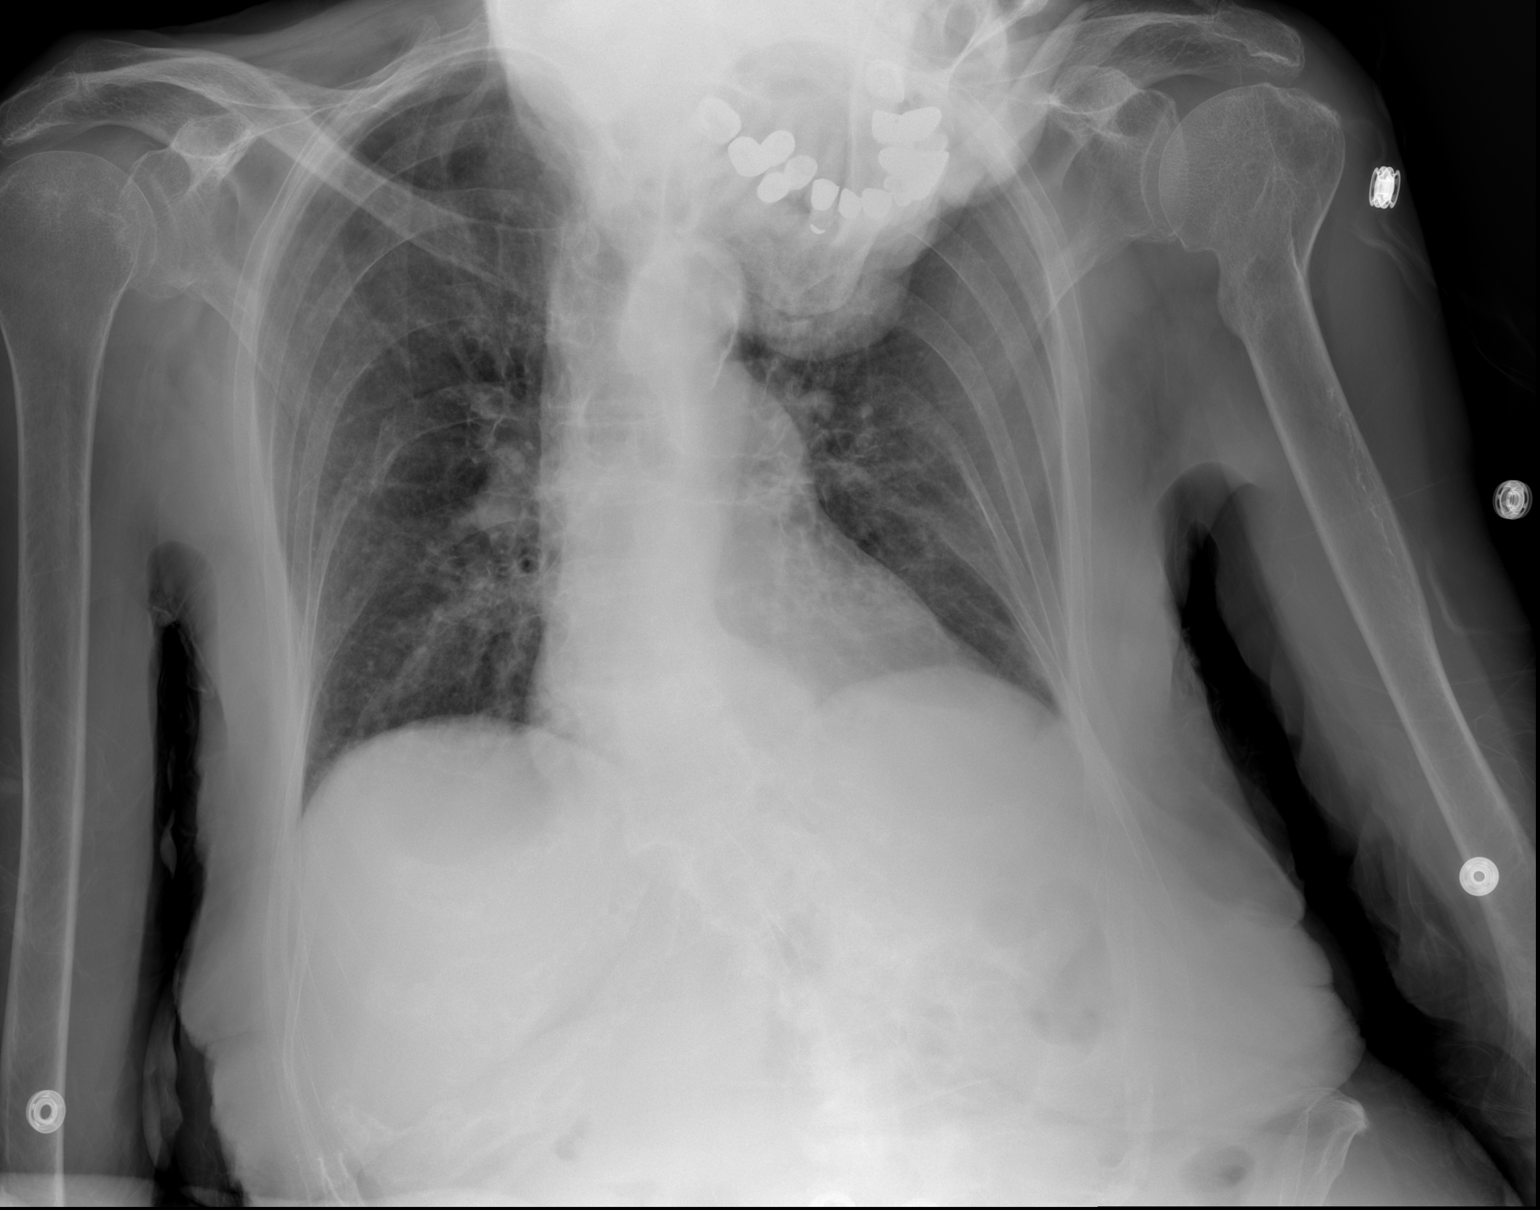

[1 of 1 positions shown; findings below may reference images not displayed]

FINDINGS: Limited evaluation of the left upper chest due to the
patient's overlying jaw.  The visualized lungs are clear.  Heart
and mediastinum are within normal limits.  There is marked
scoliosis of the thoracolumbar spine. Deformity of the proximal
left humerus consistent with an old fracture.  There is a fracture
involving the right sixth rib of unknown age.
IMPRESSION: Limited evaluation of the left upper lung as described.
Otherwise, no evidence for acute chest disease.

Right rib fracture of unknown age.

## 2013-10-16 IMAGING — CR DG TIBIA/FIBULA 2V*L*
3 series · 3 of 3 positions shown · non-contrast
Comparison: None.

CLINICAL DATA: Leg pain.

LEFT TIBIA AND FIBULA - 2 VIEW

[x tib-fib ap left (1 of 2)]
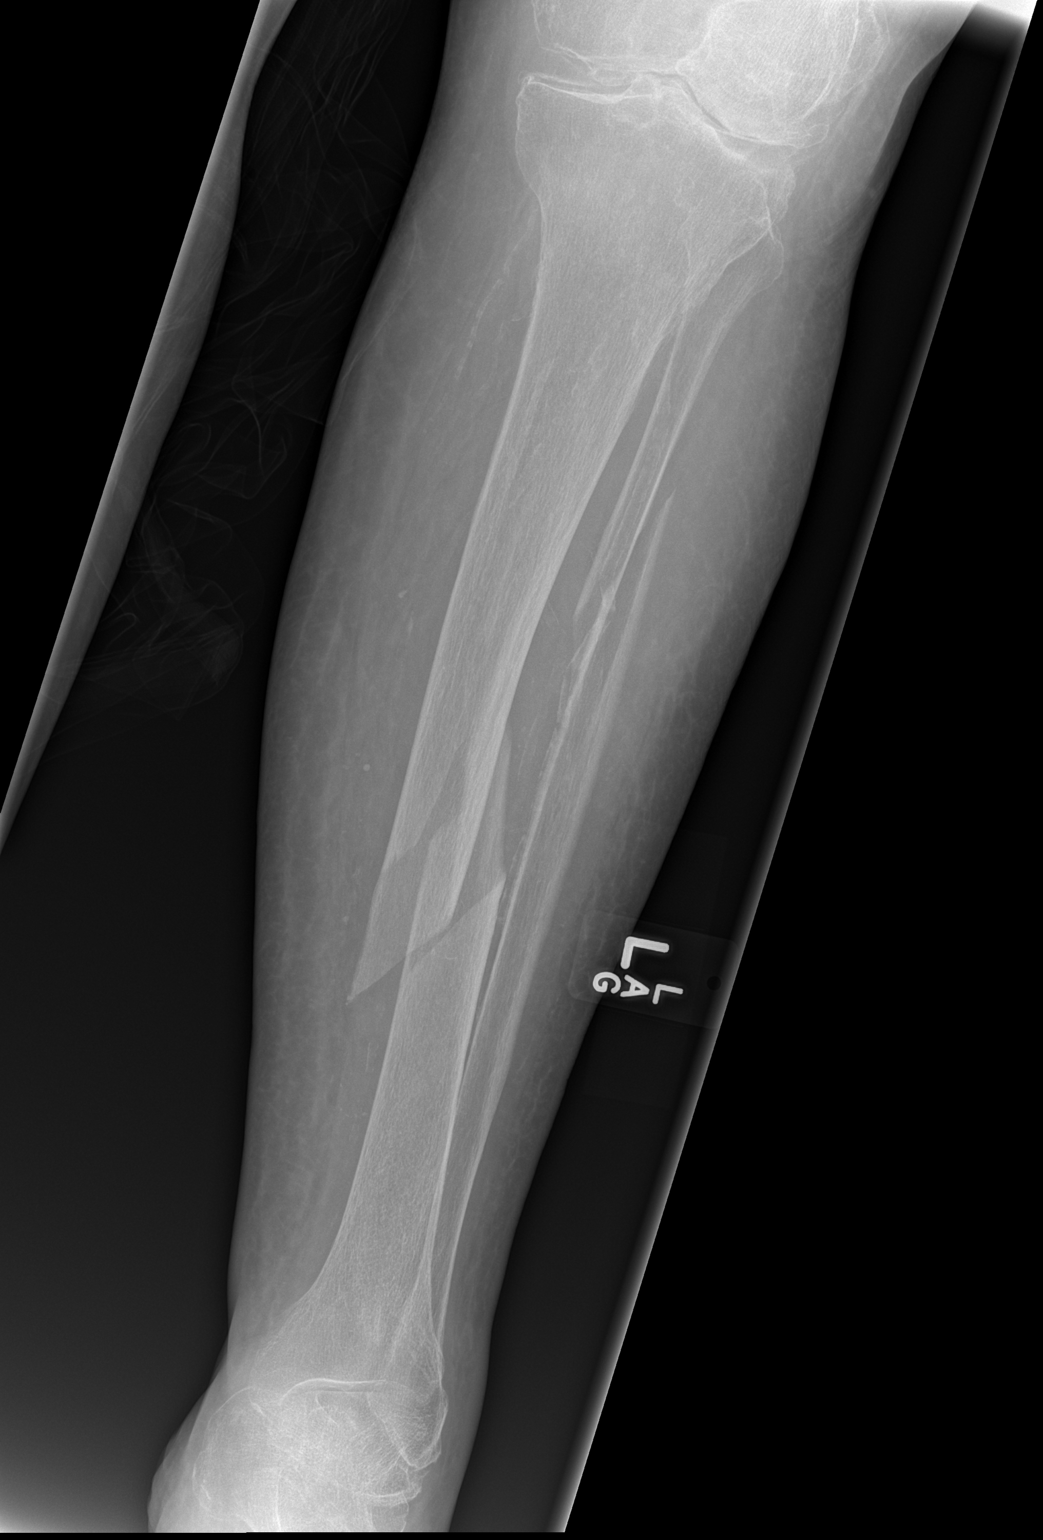

[x tib-fib ap left (2 of 2)]
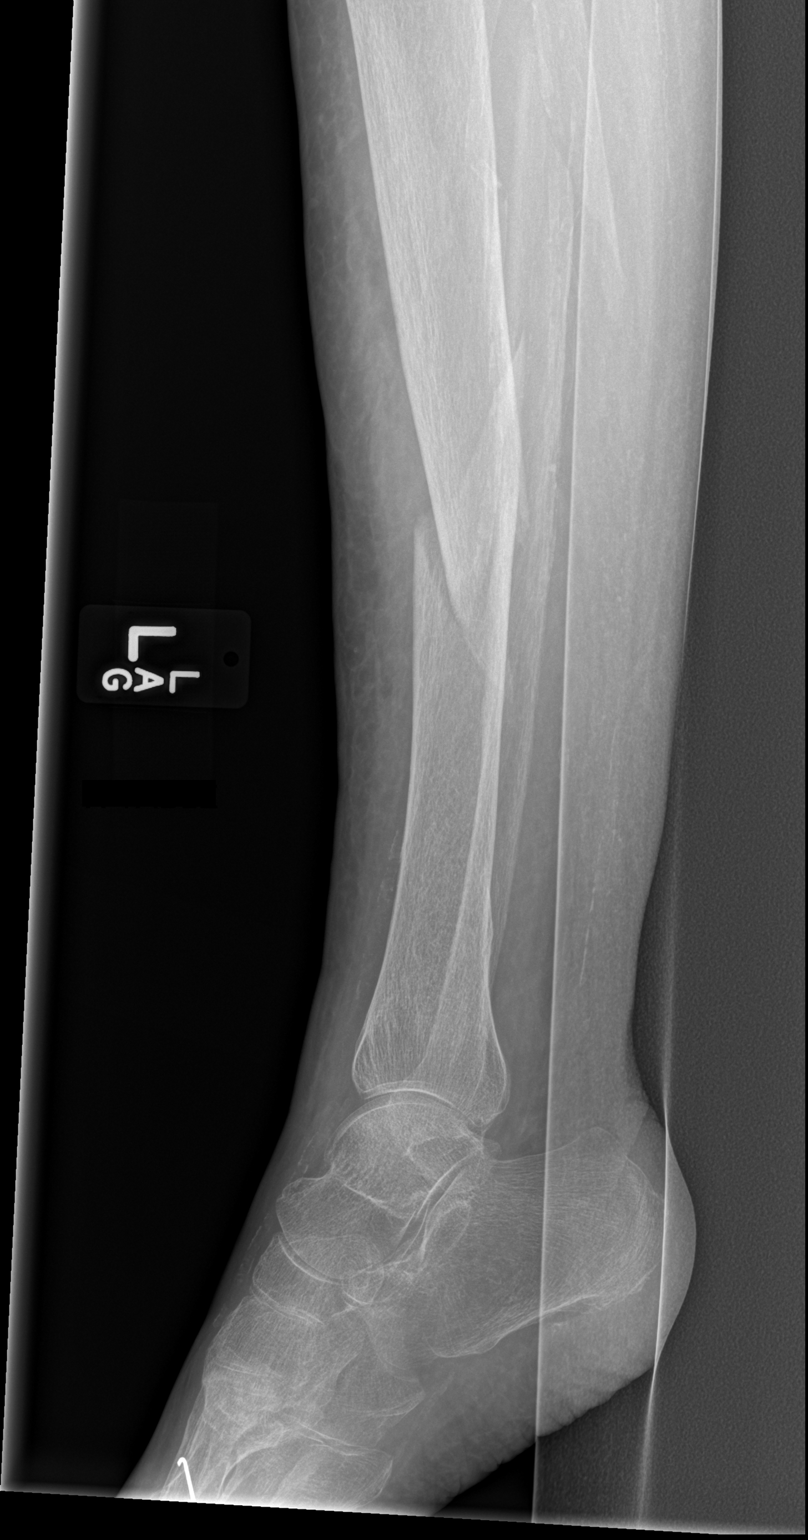

[x tib-fib lat left]
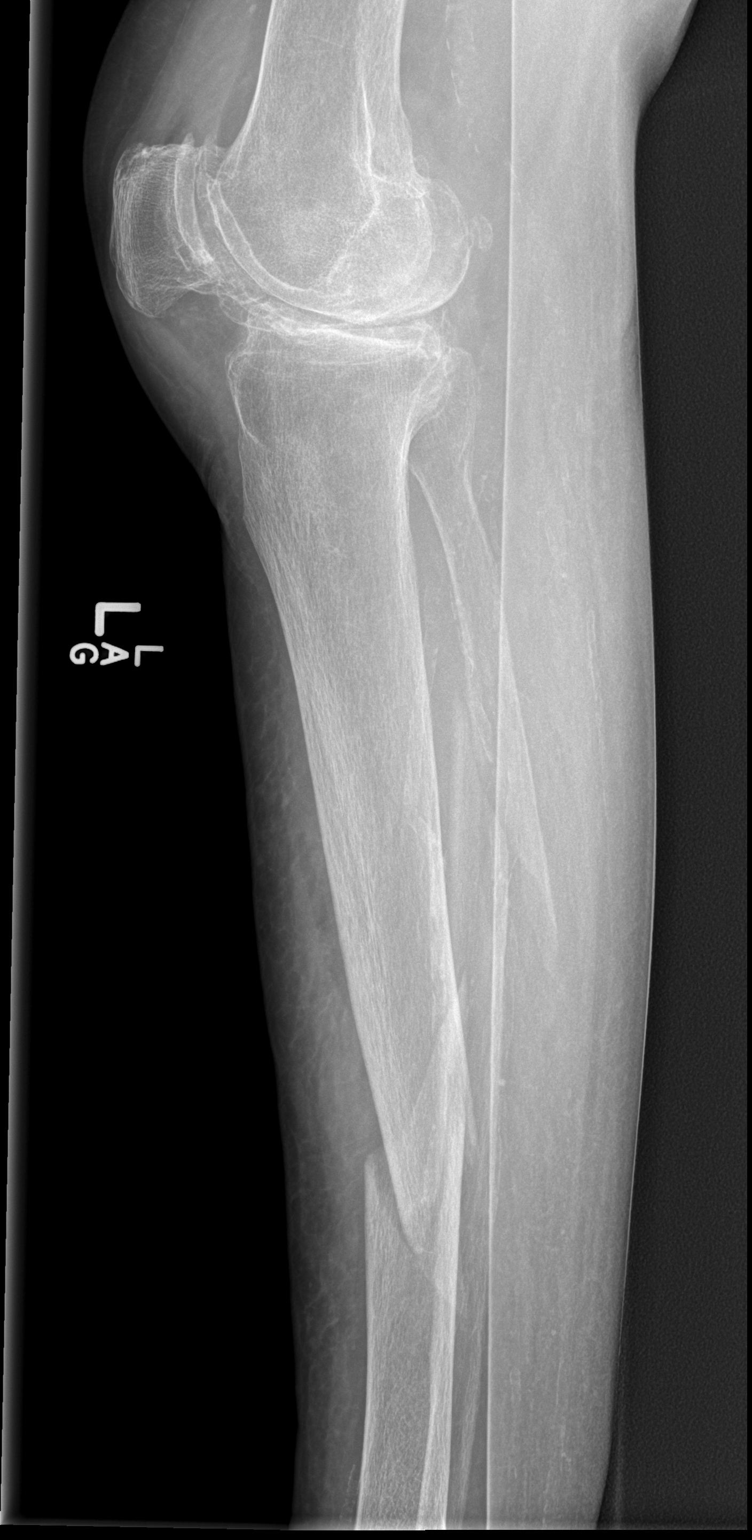

[3 of 3 positions shown; findings below may reference images not displayed]

FINDINGS: Spiral fracture of the mid shaft of the left tibia is
present.  There is one shaft lateral displacement of the tibia with
apex dorsal angulation.

The proximal fibular diaphysis also shows a spiral fracture with
one shaft lateral displacement.  There is apex dorsal angulation of
the fibular fracture with one shaft width anterior displacement of
the distal fibula.  Visualized hind foot appears within normal
limits with a K-wire fixation in the metatarsals. Partial
visualization of the left knee demonstrate severe osteoarthritis.
IMPRESSION: Spiral fractures of the shaft of the tibia and fibula.  Diffuse
osteopenia.

## 2013-10-29 ENCOUNTER — Telehealth: Payer: Self-pay

## 2013-10-29 NOTE — Telephone Encounter (Signed)
Patient past away per [facility]bit[facility] in [employer] °

## 2013-11-16 DEATH — deceased
